# Patient Record
Sex: Male | Born: 1950 | Race: White | Hispanic: No | Smoking: Former smoker
Health system: Southern US, Community
[De-identification: ages and names within clinical notes are randomized; demographics above are authoritative.]

## PROBLEM LIST (undated history)

## (undated) DIAGNOSIS — E119 Type 2 diabetes mellitus without complications: Secondary | ICD-10-CM

## (undated) DIAGNOSIS — K219 Gastro-esophageal reflux disease without esophagitis: Secondary | ICD-10-CM

## (undated) DIAGNOSIS — F32A Depression, unspecified: Secondary | ICD-10-CM

## (undated) DIAGNOSIS — C801 Malignant (primary) neoplasm, unspecified: Secondary | ICD-10-CM

## (undated) DIAGNOSIS — I10 Essential (primary) hypertension: Secondary | ICD-10-CM

## (undated) DIAGNOSIS — F329 Major depressive disorder, single episode, unspecified: Secondary | ICD-10-CM

## (undated) DIAGNOSIS — F4310 Post-traumatic stress disorder, unspecified: Secondary | ICD-10-CM

## (undated) HISTORY — PX: PACEMAKER INSERTION: SHX728

## (undated) HISTORY — PX: PROSTATECTOMY: SHX69

## (undated) HISTORY — PX: MELANOMA EXCISION: SHX5266

## (undated) HISTORY — PX: BACK SURGERY: SHX140

## (undated) HISTORY — PX: TONSILLECTOMY AND ADENOIDECTOMY: SUR1326

---

## 2012-04-30 ENCOUNTER — Inpatient Hospital Stay: Payer: Self-pay | Admitting: Specialist

## 2012-04-30 LAB — COMPREHENSIVE METABOLIC PANEL
Alkaline Phosphatase: 141 U/L — ABNORMAL HIGH (ref 50–136)
Anion Gap: 6 — ABNORMAL LOW (ref 7–16)
BUN: 12 mg/dL (ref 7–18)
Calcium, Total: 9 mg/dL (ref 8.5–10.1)
Chloride: 104 mmol/L (ref 98–107)
Co2: 32 mmol/L (ref 21–32)
EGFR (African American): >60
Glucose: 131 mg/dL — ABNORMAL HIGH (ref 65–99)
Osmolality: 285 (ref 275–301)
Potassium: 3 mmol/L — ABNORMAL LOW (ref 3.5–5.1)
SGOT(AST): 23 U/L (ref 15–37)
SGPT (ALT): 14 U/L
Total Protein: 7.4 g/dL (ref 6.4–8.2)

## 2012-04-30 LAB — CBC
HCT: 47.1 % (ref 40.0–52.0)
MCH: 30.6 pg (ref 26.0–34.0)
Platelet: 113 10*3/uL — ABNORMAL LOW (ref 150–440)
RBC: 5.06 10*6/uL (ref 4.40–5.90)
RDW: 14.4 % (ref 11.5–14.5)
WBC: 10.1 10*3/uL (ref 3.8–10.6)

## 2012-04-30 LAB — LIPASE, BLOOD: Lipase: 77 U/L (ref 73–393)

## 2012-04-30 LAB — URINALYSIS, COMPLETE
Bilirubin,UR: NEGATIVE
Blood: NEGATIVE
Hyaline Cast: 6
Ketone: NEGATIVE
Leukocyte Esterase: NEGATIVE
Nitrite: NEGATIVE
Ph: 5 (ref 4.5–8.0)
Protein: NEGATIVE
RBC,UR: <1 /HPF (ref 0–5)
Specific Gravity: 1.013 (ref 1.003–1.030)
Squamous Epithelial: NONE SEEN
WBC UR: 4 /HPF (ref 0–5)

## 2012-04-30 LAB — PROTIME-INR
INR: 0.9
Prothrombin Time: 12.6 secs (ref 11.5–14.7)

## 2012-04-30 LAB — TSH: Thyroid Stimulating Horm: 2.01 u[IU]/mL

## 2012-04-30 LAB — MAGNESIUM: Magnesium: 1.8 mg/dL

## 2012-04-30 LAB — TROPONIN I: Troponin-I: <0.02 ng/mL

## 2012-04-30 LAB — CK TOTAL AND CKMB (NOT AT ARMC): CK, Total: 29 U/L — ABNORMAL LOW (ref 35–232)

## 2012-05-01 LAB — CBC WITH DIFFERENTIAL/PLATELET
Basophil #: 0 10*3/uL (ref 0.0–0.1)
Basophil %: 0.2 %
Eosinophil %: 0 %
Lymphocyte #: 0.5 10*3/uL — ABNORMAL LOW (ref 1.0–3.6)
Lymphocyte %: 7 %
MCV: 93 fL (ref 80–100)
Monocyte %: 1 %
Neutrophil %: 91.8 %
Platelet: 99 10*3/uL — ABNORMAL LOW (ref 150–440)
RBC: 4.62 10*6/uL (ref 4.40–5.90)
RDW: 14.5 % (ref 11.5–14.5)
WBC: 6.9 10*3/uL (ref 3.8–10.6)

## 2012-05-01 LAB — BASIC METABOLIC PANEL
Calcium, Total: 8.9 mg/dL (ref 8.5–10.1)
Chloride: 105 mmol/L (ref 98–107)
Co2: 29 mmol/L (ref 21–32)
Glucose: 212 mg/dL — ABNORMAL HIGH (ref 65–99)
Osmolality: 291 (ref 275–301)
Potassium: 3.5 mmol/L (ref 3.5–5.1)
Sodium: 142 mmol/L (ref 136–145)

## 2012-05-07 LAB — CULTURE, BLOOD (SINGLE)

## 2015-01-27 NOTE — H&P (Signed)
PATIENT NAME:  David Christensen, David Christensen MR#:  [id number] DATE OF BIRTH:  [birth year]  DATE OF ADMISSION:  04/30/2012  PRIMARY MD: [place]   ED REFERRING DOCTOR: Dr. Renard Hamper    CHIEF COMPLAINT: Back pain, shortness of breath.   HISTORY OF PRESENT ILLNESS: The patient is a 64 year old Caucasian male with history of having lumbar spine surgery in the 1990's after a disk rupture. He also has problem with chronic neck pain and neck disk herniation. He woke up with severe back pain. The patient reports that he did not lift anything heavy, has not had any trauma to his back, or has had any fall. That was one of the reasons he came to the ED but he was also having shortness of breath for the past few days. He is not on any oxygen at home and he was noted to have sats in the 80's and he came to the ED. He had an ABG which did show hypoxia. The patient reports that he has been having wheezing for the past few days. He has been having shortness of breath with exertion and has been having some productive cough with whitish sputum. He has not had any fevers or chills. He does not have any problem with nocturia, dyspnea, or orthopnea. He does complain of occasional swelling in his legs. He reports that he has been also having worsening shaking of his hands. He otherwise denies any chest pains or palpitations. No syncope. No abdominal pain, nausea, vomiting, or diarrhea. Denies any urinary frequency, urgency, or hesitancy.   PAST MEDICAL HISTORY: 1. Chronic neck pain.  2. Benign prostatic hypertrophy.  3. Chronic obstructive pulmonary disease.  4. Hypertension.  5. Diabetes, type II.  6. Bipolar/depression.   PAST SURGICAL HISTORY:  1. Status post lumbar diskectomy.  2. Bilateral thumb surgery.   ALLERGIES: None.   MEDICATIONS PER HIS LIST:  1. Sertraline 100 daily.  2. Latuda 40 mg daily.  3. Gabapentin 2400 mg. He does not have the frequency.  4. Ambien 10 mg at bedtime.  5. Omeprazole 20  daily.  6. Glimepiride 4 mg daily. 7. Losartan 100/25 daily.  8. Doxazosin 2 mg daily.  9. Aspirin 81 one tab p.o. daily.  10. Cyclobenzaprine 10 mg p.r.n.  11. Flonase 50 mcg into each nostril daily.  12. Albuterol as needed.  13. He is also on hydrocodone. Dose of this is not known.   SOCIAL HISTORY: Smokes 2 packs per day. He used to smoke and then quit for 16 years and now smoking back again. Denies any alcohol or drug use. Lives alone.   FAMILY HISTORY: Positive for hypertension. Otherwise, he is not aware of any heart disease or any other medical problems.   REVIEW OF SYSTEMS: CONSTITUTIONAL: Denies fever. Complains of some fatigue, weakness, back pain. No weight loss. No weight gain. EYES: No blurred or double vision. No redness. No inflammation. No glaucoma. ENT: No tinnitus. No ear pain. No hearing loss. No difficulty swallowing. RESPIRATORY: Complains of cough which is whitish sputum. Also complains of wheezing. No hemoptysis. Does have a history of COPD. CARDIOVASCULAR: Denies any chest pain, orthopnea, intermittent edema. No arrhythmias. No syncope. GI: No nausea, vomiting, diarrhea. No abdominal pain. No hematemesis. No melena. GU: Denies any dysuria, hematuria, renal colic, or frequency. ENDOCRINE: Denies any polyuria, nocturia, or thyroid problems. HEME/LYMPH: Denies any easy bruisability or bleeding. SKIN: No acne. No rash. No changes in mole, hair or skin. MUSCULOSKELETAL: Denies any pain in the  neck, back, or shoulder. No gout. NEUROLOGIC: No numbness. No CVA. No TIA. No seizures. PSYCHIATRIC: No anxiety. No insomnia. No ADD. No OCD.   PHYSICAL EXAMINATION:   VITAL SIGNS: Temperature 98.1, pulse 72, respirations 18, blood pressure 104/63, O2 97%.   GENERAL: The patient is a 64 year old Caucasian male in no acute distress.   HEENT: Pupils equal, round, reactive to light and accommodation. There is no conjunctival pallor. No scleral icterus. Extraocular movements intact. Nasal  exam shows no drainage or ulceration. Oropharynx is clear without any exudate.  NECK: No thyromegaly. No carotid bruits.   CARDIOVASCULAR: Regular rate and rhythm. No murmurs, rubs, clicks, or gallops. PMI is not displaced.   LUNGS: He has bilateral wheezing without any rales, rhonchi. There is no accessory muscle usage.   ABDOMEN: Soft, nontender, nondistended. Positive bowel sounds x4.   EXTREMITIES: No clubbing, cyanosis, or edema.   SKIN: No rash.   LYMPHATICS: No lymph nodes palpable.   VASCULAR: Good DP, PT pulses.   MUSCULOSKELETAL: He has some tenderness in the lower lumbar region.   NEUROLOGICAL: Awake, alert, oriented x3. Cranial nerves II through XII grossly intact without any focal deficits.   PSYCHIATRIC: Not anxious or depressed.   VASCULAR: Good DP, PT pulses.   LABORATORY, DIAGNOSTIC, AND RADIOLOGICAL DATA: Glucose 131, BUN 12, creatinine 1.30, sodium 142, potassium 3.0, chloride 104, CO2 32, calcium 9.0. LFTs showed alkaline phosphatase of 141. CPK 29. CK-MB less than 0.5. Troponin less than 0.02. WBC 10.1, hemoglobin 15.5, platelet count 113. INR 0.9. ABG pH 7.36, pCO2 55, pO2 43.   Chest x-ray is currently pending.   ASSESSMENT AND PLAN: The patient is a 64 year old white male with history of COPD, hypertension, and bipolar disorder who developed back pain earlier today, now improved, who comes to the ED with back pain and shortness of breath.  1. Shortness of breath likely due to acute on chronic COPD exacerbation. Will place him on nebulizers, IV Solu-Medrol, and p.o. antibiotics. Continue oxygen.  2. Back pain, history of lumbar surgery in the past, symptoms now improved. Will do a lumbar x-ray to make sure there is no significant pathology. 3. Hypertension. Will continue losartan/HCTZ.  4. Hypokalemia. Will replace his potassium.  5. Diabetes, type II. Will place on sliding scale insulin. Continue glimepiride.  6. Bipolar disorder. Will continue sertraline  and Latuda.  7. Restless leg syndrome. Will continue gabapentin as taking at home.  8. Gastroesophageal reflux disease. Will continue omeprazole.  9. Miscellaneous. Will place him on Lovenox for DVT prophylaxis.   TIME SPENT: 35 minutes.   ____________________________ Lafonda Mosses Posey Pronto, MD shp:drc D: 04/30/2012 18:31:12 ET T: 05/01/2012 06:03:38 ET JOB#: [id number]  cc: Davisha Linthicum H. Posey Pronto, MD, <Dictator> Alric Seton MD ELECTRONICALLY SIGNED 05/08/2012 12:12

## 2015-01-27 NOTE — Discharge Summary (Signed)
PATIENT NAME:  David Christensen, David Christensen MR#:  [id number] DATE OF BIRTH:  03/24/1951  DATE OF ADMISSION:  04/30/2012 DATE OF DISCHARGE:  05/01/2012  For a detailed note, please take a look at the history and physical done by Dr. Dustin Flock on admission.   DISCHARGE DIAGNOSES: 1. Chronic obstructive pulmonary disease exacerbation.  2. Chronic back pain.  3. Bipolar disorder.  4. Hypertension.  5. Diabetes.   DIET: The patient is being discharged on an American Diabetic Association low sodium, low fat diet.   ACTIVITY: As tolerated.   DISCHARGE FOLLOWUP: Followup with primary care physician at [facility] in the next 1 to 2 weeks.   DISCHARGE MEDICATIONS:  1. Zoloft 100 mg daily.  2. Latuda 40 mg daily.  3. Omeprazole 20 mg daily.  4. Hydrochlorothiazide/losartan 25/100 mg 1 tab daily.  5. Glimepiride 2 mg twice a day. 6. Flexeril 10 mg three times daily as needed.  7. Ambien 10 mg at bedtime. 8. Doxazosin 2 mg at bedtime.  9. Aspirin 81 mg daily.  10. Flonase one spray to each nostril daily.  11. Albuterol inhaler as needed.  12. Gabapentin 600 mg five tabs at bedtime.  13. Prednisone taper starting at 50 mg down to 10 mg over the next five days.   PERTINENT STUDIES: Chest x-ray done on admission showed no acute cardiopulmonary disease.  X-ray of the lumbar spine showed degenerative disk disease of L5-S1.   HOSPITAL COURSE: This 64 year old male with medical problems as mentioned above who presented to the hospital with shortness of breath and also back pain.  1. Shortness of breath likely secondary to chronic obstructive pulmonary disease exacerbation secondary to ongoing tobacco abuse. The patient's chest x-ray did not show any evidence of pneumonia. He was also empirically started on Levaquin, also started on IV steroids, and around-the-clock nebulizer treatments. The patient's clinical symptoms have significantly improved since admission. He is currently afebrile and  hemodynamically stable. He was ambulated on room air and did not qualify for any home oxygen. The patient currently is being discharged on a short prednisone taper and strongly advised to quit smoking.  2. Back pain. The patient underwent a lumbar spine x-ray which showed some degenerative disk disease in the L5-S1 area, although the back pain had improved with some p.r.n. Percocet. This can be further followed up by his primary care physician.  3. Hypertension. The patient remained hemodynamically stable and will continue his losartan/hydrochlorothiazide as mentioned.  4. Diabetes. The patient will continue his glimepiride as stated.  5. Bipolar disorder/depression. The patient is on Zoloft and he will continue that along with his Latuda.  6. Diabetic neuropathy/restless leg syndrome. The patient will continue his high dose Neurontin as stated.   CODE STATUS: The patient is a FULL CODE.  TIME SPENT: 35 minutes.  ____________________________ Belia Heman. Verdell Carmine, MD vjs:slb D: 05/01/2012 14:51:42 ET     T: 05/02/2012 11:11:57 ET        JOB#: [id number] cc: Belia Heman. Verdell Carmine, MD, <Dictator> PCP - [place] MD ELECTRONICALLY SIGNED 05/04/2012 16:19

## 2015-02-26 ENCOUNTER — Encounter: Payer: Self-pay | Admitting: Emergency Medicine

## 2015-02-26 ENCOUNTER — Inpatient Hospital Stay
Admission: EM | Admit: 2015-02-26 | Discharge: 2015-03-02 | DRG: 418 | Disposition: A | Discharge status: Home / Self Care | Payer: Medicare Other | Attending: Surgery | Admitting: Surgery

## 2015-02-26 ENCOUNTER — Emergency Department: Payer: Medicare Other

## 2015-02-26 DIAGNOSIS — Z823 Family history of stroke: Secondary | ICD-10-CM

## 2015-02-26 DIAGNOSIS — K219 Gastro-esophageal reflux disease without esophagitis: Secondary | ICD-10-CM | POA: Diagnosis present

## 2015-02-26 DIAGNOSIS — K567 Ileus, unspecified: Secondary | ICD-10-CM | POA: Diagnosis present

## 2015-02-26 DIAGNOSIS — Z87891 Personal history of nicotine dependence: Secondary | ICD-10-CM

## 2015-02-26 DIAGNOSIS — F329 Major depressive disorder, single episode, unspecified: Secondary | ICD-10-CM | POA: Diagnosis present

## 2015-02-26 DIAGNOSIS — K819 Cholecystitis, unspecified: Secondary | ICD-10-CM | POA: Insufficient documentation

## 2015-02-26 DIAGNOSIS — R109 Unspecified abdominal pain: Secondary | ICD-10-CM | POA: Diagnosis not present

## 2015-02-26 DIAGNOSIS — Z85828 Personal history of other malignant neoplasm of skin: Secondary | ICD-10-CM

## 2015-02-26 DIAGNOSIS — Z950 Presence of cardiac pacemaker: Secondary | ICD-10-CM

## 2015-02-26 DIAGNOSIS — I10 Essential (primary) hypertension: Secondary | ICD-10-CM | POA: Diagnosis present

## 2015-02-26 DIAGNOSIS — K810 Acute cholecystitis: Principal | ICD-10-CM | POA: Diagnosis present

## 2015-02-26 DIAGNOSIS — R1011 Right upper quadrant pain: Secondary | ICD-10-CM

## 2015-02-26 HISTORY — DX: Gastro-esophageal reflux disease without esophagitis: K21.9

## 2015-02-26 HISTORY — DX: Essential (primary) hypertension: I10

## 2015-02-26 HISTORY — DX: Malignant (primary) neoplasm, unspecified: C80.1

## 2015-02-26 HISTORY — DX: Depression, unspecified: F32.A

## 2015-02-26 HISTORY — DX: Major depressive disorder, single episode, unspecified: F32.9

## 2015-02-26 LAB — CBC
HCT: 41.1 % (ref 40.0–52.0)
HEMOGLOBIN: 14.2 g/dL (ref 13.0–18.0)
MCH: 32.4 pg (ref 26.0–34.0)
MCHC: 34.7 g/dL (ref 32.0–36.0)
MCV: 93.3 fL (ref 80.0–100.0)
Platelets: 100 10*3/uL — ABNORMAL LOW (ref 150–440)
RBC: 4.4 MIL/uL (ref 4.40–5.90)
RDW: 13.2 % (ref 11.5–14.5)
WBC: 7.2 10*3/uL (ref 3.8–10.6)

## 2015-02-26 LAB — COMPREHENSIVE METABOLIC PANEL
ALT: 15 U/L — ABNORMAL LOW (ref 17–63)
AST: 19 U/L (ref 15–41)
Albumin: 4.3 g/dL (ref 3.5–5.0)
Alkaline Phosphatase: 69 U/L (ref 38–126)
Anion gap: 10 (ref 5–15)
BUN: 24 mg/dL — ABNORMAL HIGH (ref 6–20)
CALCIUM: 9.5 mg/dL (ref 8.9–10.3)
CO2: 31 mmol/L (ref 22–32)
Chloride: 103 mmol/L (ref 101–111)
Creatinine, Ser: 0.92 mg/dL (ref 0.61–1.24)
GFR calc Af Amer: >60 mL/min (ref >60)
GFR calc non Af Amer: >60 mL/min (ref >60)
Glucose, Bld: 162 mg/dL — ABNORMAL HIGH (ref 65–99)
Potassium: 3.8 mmol/L (ref 3.5–5.1)
Sodium: 144 mmol/L (ref 135–145)
TOTAL PROTEIN: 6.9 g/dL (ref 6.5–8.1)
Total Bilirubin: 0.4 mg/dL (ref 0.3–1.2)

## 2015-02-26 LAB — LIPASE, BLOOD: LIPASE: 33 U/L (ref 22–51)

## 2015-02-26 MED ORDER — SODIUM CHLORIDE 0.9 % IV SOLN
INTRAVENOUS | Status: DC
  Administered 2015-02-26 – 2015-02-27 (×2): via INTRAVENOUS
  Administered 2015-02-27: 1000 mL via INTRAVENOUS
  Administered 2015-02-28: 02:00:00 via INTRAVENOUS
  Administered 2015-02-28: 1000 mL via INTRAVENOUS
  Administered 2015-03-01: via INTRAVENOUS
  Administered 2015-03-01: 1000 mL via INTRAVENOUS
  Administered 2015-03-02 (×2): via INTRAVENOUS

## 2015-02-26 MED ORDER — PANTOPRAZOLE SODIUM 40 MG IV SOLR
40.0000 mg | Freq: Every day | INTRAVENOUS | Status: DC
  Administered 2015-02-26 – 2015-03-01 (×4): 40 mg via INTRAVENOUS
  Filled 2015-02-26 (×4): qty 40

## 2015-02-26 MED ORDER — IOHEXOL 300 MG/ML  SOLN
100.0000 mL | Freq: Once | INTRAMUSCULAR | Status: AC | PRN
  Administered 2015-02-26: 100 mL via INTRAVENOUS

## 2015-02-26 MED ORDER — MORPHINE SULFATE 4 MG/ML IJ SOLN
4.0000 mg | Freq: Once | INTRAMUSCULAR | Status: AC
  Administered 2015-02-26: 4 mg via INTRAVENOUS

## 2015-02-26 MED ORDER — SODIUM CHLORIDE 0.9 % IV BOLUS (SEPSIS)
1000.0000 mL | Freq: Once | INTRAVENOUS | Status: AC
Start: 2015-02-26 — End: 2015-02-26
  Administered 2015-02-26: 1000 mL via INTRAVENOUS

## 2015-02-26 MED ORDER — HEPARIN SODIUM (PORCINE) 5000 UNIT/ML IJ SOLN
5000.0000 [IU] | Freq: Three times a day (TID) | INTRAMUSCULAR | Status: DC
  Administered 2015-02-26 – 2015-02-28 (×6): 5000 [IU] via SUBCUTANEOUS
  Filled 2015-02-26 (×6): qty 1

## 2015-02-26 MED ORDER — ONDANSETRON HCL 4 MG/2ML IJ SOLN
4.0000 mg | Freq: Once | INTRAMUSCULAR | Status: AC
  Administered 2015-02-26: 4 mg via INTRAVENOUS

## 2015-02-26 MED ORDER — HYDROMORPHONE HCL 1 MG/ML IJ SOLN
1.0000 mg | Freq: Once | INTRAMUSCULAR | Status: AC
  Administered 2015-02-26: 1 mg via INTRAVENOUS

## 2015-02-26 MED ORDER — CIPROFLOXACIN IN D5W 400 MG/200ML IV SOLN
INTRAVENOUS | Status: AC
  Filled 2015-02-26: qty 200

## 2015-02-26 MED ORDER — CIPROFLOXACIN IN D5W 400 MG/200ML IV SOLN
400.0000 mg | Freq: Two times a day (BID) | INTRAVENOUS | Status: DC
  Administered 2015-02-26 – 2015-03-01 (×7): 400 mg via INTRAVENOUS
  Filled 2015-02-26 (×8): qty 200

## 2015-02-26 MED ORDER — MORPHINE SULFATE 2 MG/ML IJ SOLN
2.0000 mg | Freq: Once | INTRAMUSCULAR | Status: AC
  Administered 2015-02-26: 2 mg via INTRAVENOUS

## 2015-02-26 MED ORDER — HYDROMORPHONE HCL 1 MG/ML IJ SOLN
INTRAMUSCULAR | Status: AC
  Administered 2015-02-26: 1 mg via INTRAVENOUS
  Filled 2015-02-26: qty 1

## 2015-02-26 MED ORDER — METRONIDAZOLE IN NACL 5-0.79 MG/ML-% IV SOLN
500.0000 mg | Freq: Three times a day (TID) | INTRAVENOUS | Status: DC
  Administered 2015-02-26 – 2015-03-01 (×10): 500 mg via INTRAVENOUS
  Filled 2015-02-26 (×13): qty 100

## 2015-02-26 MED ORDER — HYDROMORPHONE HCL 1 MG/ML IJ SOLN
1.0000 mg | INTRAMUSCULAR | Status: DC | PRN
  Administered 2015-02-26 – 2015-02-28 (×9): 1 mg via INTRAVENOUS
  Filled 2015-02-26 (×11): qty 1

## 2015-02-26 MED ORDER — IOHEXOL 240 MG/ML SOLN
50.0000 mL | INTRAMUSCULAR | Status: AC
  Administered 2015-02-26: 50 mL via ORAL

## 2015-02-26 MED ORDER — MORPHINE SULFATE 4 MG/ML IJ SOLN
INTRAMUSCULAR | Status: AC
  Administered 2015-02-26: 4 mg via INTRAVENOUS
  Filled 2015-02-26: qty 1

## 2015-02-26 MED ORDER — MORPHINE SULFATE 2 MG/ML IJ SOLN
INTRAMUSCULAR | Status: AC
  Administered 2015-02-26: 2 mg via INTRAVENOUS
  Filled 2015-02-26: qty 1

## 2015-02-26 MED ORDER — ONDANSETRON HCL 4 MG/2ML IJ SOLN
INTRAMUSCULAR | Status: AC
  Administered 2015-02-26: 4 mg via INTRAVENOUS
  Filled 2015-02-26: qty 2

## 2015-02-26 MED ORDER — ONDANSETRON HCL 4 MG/2ML IJ SOLN
4.0000 mg | Freq: Four times a day (QID) | INTRAMUSCULAR | Status: DC | PRN
Start: 2015-02-26 — End: 2015-03-02
  Administered 2015-02-28: 4 mg via INTRAVENOUS

## 2015-02-26 MED ORDER — TECHNETIUM TC 99M MEBROFENIN IV KIT
5.5000 | PACK | Freq: Once | INTRAVENOUS | Status: AC | PRN
  Administered 2015-02-26: 5.5 via INTRAVENOUS

## 2015-02-26 MED ORDER — MORPHINE SULFATE 4 MG/ML IJ SOLN
4.0000 mg | Freq: Once | INTRAMUSCULAR | Status: AC
Start: 2015-02-26 — End: 2015-02-26
  Administered 2015-02-26: 4 mg via INTRAVENOUS

## 2015-02-26 MED ORDER — TECHNETIUM TC 99M MEBROFENIN IV KIT
2.0000 | PACK | Freq: Once | INTRAVENOUS | Status: AC | PRN
  Administered 2015-02-26: 2.18 via INTRAVENOUS

## 2015-02-26 MED ORDER — MORPHINE SULFATE 4 MG/ML IJ SOLN
3.2000 mg | Freq: Once | INTRAMUSCULAR | Status: AC
  Administered 2015-02-26: 3.2 mg via INTRAVENOUS
  Filled 2015-02-26: qty 1

## 2015-02-26 MED ORDER — BACITRACIN ZINC 500 UNIT/GM EX OINT
TOPICAL_OINTMENT | CUTANEOUS | Status: AC
  Filled 2015-02-26: qty 0.9

## 2015-02-26 NOTE — ED Notes (Signed)
Asissted pt with urinal.

## 2015-02-26 NOTE — ED Notes (Signed)
Pt's O2 sat noted to drop into 80s. Pt placed on O2 2L via nasal cannula. MD made aware.

## 2015-02-26 NOTE — ED Notes (Signed)
Pt presents to ED via EMS from home with c/o severe abdominal pain. Pt reports having prostate surgery earlier today. Pt states, "She fixed me chili beans with onions... That's why I'm hurting." Pt reports generalized abdominal pain. Pt denies pain to groin area. Pt reports that he does not believe the pain is related to his surgery. Pt is awake and alert, crying during triage.

## 2015-02-26 NOTE — Progress Notes (Signed)
Reported to [facility]

## 2015-02-26 NOTE — H&P (Signed)
Surgery History and Physical  64 yo M with history of pacemaker placement who presents with 1 day of abdominal distention and pain.  Began acutely.  + nausea.  Pain located in bilateral lower periumbilical regions.  No fevers/chills.  Has never had this pain before. No chest pain, shortness of breath, headache, cough, diarrhea/constiopation, dysuria/hematuria.  Past Medical History  Diagnosis Date  . Hypertension   . Acid reflux   . Depression   . Cancer     skin cancer   Family History  Problem Relation Age of Onset  . Lung disease Mother   . Stroke Father    History   Social History  . Marital Status: Widowed    Spouse Name: N/A  . Number of Children: N/A  . Years of Education: N/A   Occupational History  . Not on file.   Social History Main Topics  . Smoking status: Former Research scientist (life sciences)  . Smokeless tobacco: Not on file  . Alcohol Use: 1.2 oz/week    2 Shots of liquor per week  . Drug Use: No  . Sexual Activity: Not on file   Other Topics Concern  . Not on file   Social History Narrative  . No narrative on file   Review of systems: Complete ROS obtained.  Pertinent positives and negatives as above.    Blood pressure 137/64, pulse 60, temperature 97.8 F (36.6 C), temperature source Oral, resp. rate 21, height 8' 6.5" (2.604 m), weight 80.786 kg (178 lb 1.6 oz), SpO2 93 %. GEN: NAD/A&ox3 HEAD: [place]/AT EYES: no scleral icterus, no conjunctivitis FACE: No obvious facial trauma CV: RRR, no MRG CHEST: clear bilaterally, moving air well ABD: Marked distention, mildtender lower abdomen EXT: moving all ext well, strength 5/5 NEURO: CN II-XII grossly intact, sensation intact all 4 ext  Labs:  Reviewed, WBC 7.2  CT: reviewed, thickened bladder, distended gallbladder U/S: distended gallbladder, no stones  A/P 64 yo M with abdominal pain and +distended gallbladder.  Not suggestive of cholecystitis, bladder thickening may be a clue.  Obtain HIDA scan, urinalysis.  Unsure  etiology of abdominal distention because not impressive on CT.

## 2015-02-26 NOTE — ED Provider Notes (Signed)
[facility] Emergency Department Provider Note  ____________________________________________  Time seen: 1:40 AM  I have reviewed the triage vital signs and the nursing notes.   HISTORY  Chief Complaint Abdominal Pain      HPI David Christensen is a 64 y.o. male presents with generalized abdominal pain 10 out of 10 that is described as sharp. Patient denies fever positive nausea no vomiting of note patient state pain started after eating chili beans and onions. Of note patient had a prostatectomy done yesterday.     Past Medical History  Diagnosis Date  . Hypertension   . Acid reflux   . Depression   . Cancer     skin cancer    There are no active problems to display for this patient.   Past Surgical History  Procedure Laterality Date  . Pacemaker insertion Left     No current outpatient prescriptions on file.  Allergies Metformin and related and Penicillins  No family history on file.  Social History History  Substance Use Topics  . Smoking status: Former Research scientist (life sciences)  . Smokeless tobacco: Not on file  . Alcohol Use: Yes    Review of Systems  Constitutional: Negative for fever. Eyes: Negative for visual changes. ENT: Negative for sore throat. Cardiovascular: Negative for chest pain. Respiratory: Negative for shortness of breath. Gastrointestinal: Positive for abdominal pain, vomiting and diarrhea. Genitourinary: Negative for dysuria. Musculoskeletal: Negative for back pain. Skin: Negative for rash. Neurological: Negative for headaches, focal weakness or numbness.    10-point ROS otherwise negative.  ____________________________________________   PHYSICAL EXAM:  VITAL SIGNS: ED Triage Vitals  Enc Vitals Group     BP 02/26/15 0132 106/36 mmHg     Pulse Rate 02/26/15 0132 61     Resp 02/26/15 0132 20     Temp 02/26/15 0132 97.9 F (36.6 C)     Temp Source 02/26/15 0132 Oral     SpO2 02/26/15 0132 100 %     Weight 02/26/15  0132 180 lb (81.647 kg)     Height 02/26/15 0132 5\' 6"  (1.676 m)     Head Cir --      Peak Flow --      Pain Score 02/26/15 0133 10     Pain Loc --      Pain Edu? --      Excl. in [place]? --      Constitutional: Alert and oriented. Well appearing and in no distress. Eyes: Conjunctivae are normal. PERRL. Normal extraocular movements. ENT   Head: Normocephalic and atraumatic.   Nose: No congestion/rhinnorhea.   Mouth/Throat: Mucous membranes are moist.   Neck: No stridor. Hematological/Lymphatic/Immunilogical: No cervical lymphadenopathy. Cardiovascular: Normal rate, regular rhythm. Normal and symmetric distal pulses are present in all extremities. No murmurs, rubs, or gallops. Respiratory: Normal respiratory effort without tachypnea nor retractions. Breath sounds are clear and equal bilaterally. No wheezes/rales/rhonchi. Gastrointestinal: Diffuse abdominal tenderness to palpation worse in the right upper quadrant. Positive guarding no rebound. Genitourinary: deferred Musculoskeletal: Nontender with normal range of motion in all extremities. No joint effusions.  No lower extremity tenderness nor edema. Neurologic:  Normal speech and language. No gross focal neurologic deficits are appreciated. Speech is normal.  Skin:  Skin is warm, dry and intact. No rash noted. Psychiatric: Mood and affect are normal. Speech and behavior are normal. Patient exhibits appropriate insight and judgment.  ____________________________________________    LABS (pertinent positives/negatives)  Labs Reviewed  CBC - Abnormal; Notable for the following:  Platelets 100 (*)    All other components within normal limits  COMPREHENSIVE METABOLIC PANEL - Abnormal; Notable for the following:    Glucose, Bld 162 (*)    BUN 24 (*)    ALT 15 (*)    All other components within normal limits  URINALYSIS COMPLETEWITH MICROSCOPIC ([facility])  - Abnormal; Notable for the following:    Color, Urine YELLOW (*)     APPearance HAZY (*)    Hgb urine dipstick 3+ (*)    pH 9.0 (*)    Protein, ur 100 (*)    Squamous Epithelial / LPF 0-5 (*)    All other components within normal limits  CBC - Abnormal; Notable for the following:    RBC 3.90 (*)    Hemoglobin 12.6 (*)    HCT 36.8 (*)    Platelets 75 (*)    All other components within normal limits  COMPREHENSIVE METABOLIC PANEL - Abnormal; Notable for the following:    Potassium 3.3 (*)    Glucose, Bld 148 (*)    Calcium 8.3 (*)    Total Protein 6.2 (*)    AST 261 (*)    ALT 260 (*)    Alkaline Phosphatase 188 (*)    All other components within normal limits  PLATELET COUNT - Abnormal; Notable for the following:    Platelets 84 (*)    All other components within normal limits  CBC - Abnormal; Notable for the following:    RBC 3.35 (*)    Hemoglobin 10.7 (*)    HCT 31.5 (*)    Platelets 90 (*)    All other components within normal limits  COMPREHENSIVE METABOLIC PANEL - Abnormal; Notable for the following:    Glucose, Bld 116 (*)    Calcium 7.9 (*)    Total Protein 5.7 (*)    Albumin 3.0 (*)    ALT 87 (*)    Total Bilirubin 0.2 (*)    All other components within normal limits  URINE CULTURE  LIPASE, BLOOD  SURGICAL PATHOLOGY     ____________________________________________       RADIOLOGY  ED abdomen pelvis revealed dilated gallbladder recommendation for ultrasound   ____________________________________________      INITIAL IMPRESSION / ASSESSMENT AND PLAN / ED COURSE  Pertinent labs & imaging results that were available during my care of the patient were reviewed by me and considered in my medical decision making (see chart for details).  History physical exam concern for intra-abdominal pathology as such CT scan of the abdomen performed which revealed dilated gallbladder. With recommendation for ultrasound.  ____________________________________________   FINAL CLINICAL IMPRESSION(S) / ED DIAGNOSES  Final  diagnoses:  RUQ abdominal pain      Gregor Hams, MD 03/12/15 [phone]

## 2015-02-27 DIAGNOSIS — Z823 Family history of stroke: Secondary | ICD-10-CM | POA: Diagnosis not present

## 2015-02-27 DIAGNOSIS — Z87891 Personal history of nicotine dependence: Secondary | ICD-10-CM | POA: Diagnosis not present

## 2015-02-27 DIAGNOSIS — K819 Cholecystitis, unspecified: Secondary | ICD-10-CM | POA: Diagnosis present

## 2015-02-27 DIAGNOSIS — I10 Essential (primary) hypertension: Secondary | ICD-10-CM | POA: Diagnosis present

## 2015-02-27 DIAGNOSIS — K567 Ileus, unspecified: Secondary | ICD-10-CM | POA: Diagnosis present

## 2015-02-27 DIAGNOSIS — F329 Major depressive disorder, single episode, unspecified: Secondary | ICD-10-CM | POA: Diagnosis present

## 2015-02-27 DIAGNOSIS — Z85828 Personal history of other malignant neoplasm of skin: Secondary | ICD-10-CM | POA: Diagnosis not present

## 2015-02-27 DIAGNOSIS — R109 Unspecified abdominal pain: Secondary | ICD-10-CM | POA: Diagnosis present

## 2015-02-27 DIAGNOSIS — K810 Acute cholecystitis: Secondary | ICD-10-CM | POA: Diagnosis present

## 2015-02-27 DIAGNOSIS — K219 Gastro-esophageal reflux disease without esophagitis: Secondary | ICD-10-CM | POA: Diagnosis present

## 2015-02-27 DIAGNOSIS — Z950 Presence of cardiac pacemaker: Secondary | ICD-10-CM | POA: Diagnosis not present

## 2015-02-27 LAB — URINALYSIS COMPLETE WITH MICROSCOPIC (ARMC ONLY)
Bacteria, UA: NONE SEEN
Bilirubin Urine: NEGATIVE
GLUCOSE, UA: NEGATIVE mg/dL
KETONES UR: NEGATIVE mg/dL
Leukocytes, UA: NEGATIVE
Nitrite: NEGATIVE
PH: 9 — AB (ref 5.0–8.0)
Protein, ur: 100 mg/dL — AB
Specific Gravity, Urine: 1.015 (ref 1.005–1.030)

## 2015-02-27 LAB — COMPREHENSIVE METABOLIC PANEL
ALT: 260 U/L — AB (ref 17–63)
ANION GAP: 7 (ref 5–15)
AST: 261 U/L — AB (ref 15–41)
Albumin: 3.6 g/dL (ref 3.5–5.0)
Alkaline Phosphatase: 188 U/L — ABNORMAL HIGH (ref 38–126)
BILIRUBIN TOTAL: 1 mg/dL (ref 0.3–1.2)
BUN: 12 mg/dL (ref 6–20)
CALCIUM: 8.3 mg/dL — AB (ref 8.9–10.3)
CO2: 28 mmol/L (ref 22–32)
Chloride: 104 mmol/L (ref 101–111)
Creatinine, Ser: 0.77 mg/dL (ref 0.61–1.24)
GFR calc Af Amer: >60 mL/min (ref >60)
GLUCOSE: 148 mg/dL — AB (ref 65–99)
Potassium: 3.3 mmol/L — ABNORMAL LOW (ref 3.5–5.1)
SODIUM: 139 mmol/L (ref 135–145)
TOTAL PROTEIN: 6.2 g/dL — AB (ref 6.5–8.1)

## 2015-02-27 LAB — CBC
HCT: 36.8 % — ABNORMAL LOW (ref 40.0–52.0)
HEMOGLOBIN: 12.6 g/dL — AB (ref 13.0–18.0)
MCH: 32.4 pg (ref 26.0–34.0)
MCHC: 34.4 g/dL (ref 32.0–36.0)
MCV: 94.3 fL (ref 80.0–100.0)
Platelets: 75 10*3/uL — ABNORMAL LOW (ref 150–440)
RBC: 3.9 MIL/uL — AB (ref 4.40–5.90)
RDW: 13.3 % (ref 11.5–14.5)
WBC: 5.3 10*3/uL (ref 3.8–10.6)

## 2015-02-27 MED ORDER — POTASSIUM CHLORIDE 10 MEQ/100ML IV SOLN
10.0000 meq | INTRAVENOUS | Status: AC
Start: 2015-02-27 — End: 2015-02-27
  Administered 2015-02-27 (×3): 10 meq via INTRAVENOUS
  Filled 2015-02-27 (×3): qty 100

## 2015-02-27 NOTE — Progress Notes (Signed)
Surgery Progress Note  S: Feels much better, multiple BM.  Hida with nonvisualization of gallbladder O: AF/VSS, good uop GEN: NAD/A&Ox3 ABD: soft, less distended, mild tender, particularly in RUQ  A/P 64 yo with likely acalculus cholecystitis, ileus - will discuss cholecystectomy if distention continues to improve, NPO for now

## 2015-02-28 ENCOUNTER — Inpatient Hospital Stay: Payer: Medicare Other | Admitting: Anesthesiology

## 2015-02-28 ENCOUNTER — Encounter
Admission: EM | Disposition: A | Discharge status: Home / Self Care | Payer: Self-pay | Source: Home / Self Care | Attending: Surgery

## 2015-02-28 HISTORY — PX: CHOLECYSTECTOMY: SHX55

## 2015-02-28 LAB — URINE CULTURE: CULTURE: NO GROWTH

## 2015-02-28 LAB — PLATELET COUNT: Platelets: 84 10*3/uL — ABNORMAL LOW (ref 150–440)

## 2015-02-28 SURGERY — LAPAROSCOPIC CHOLECYSTECTOMY
Anesthesia: General

## 2015-02-28 MED ORDER — FENTANYL CITRATE (PF) 100 MCG/2ML IJ SOLN
25.0000 ug | INTRAMUSCULAR | Status: DC | PRN
  Administered 2015-02-28 (×4): 25 ug via INTRAVENOUS

## 2015-02-28 MED ORDER — DEXAMETHASONE SODIUM PHOSPHATE 4 MG/ML IJ SOLN
INTRAMUSCULAR | Status: DC | PRN
  Administered 2015-02-28: 4 mg via INTRAVENOUS

## 2015-02-28 MED ORDER — NEOSTIGMINE METHYLSULFATE 10 MG/10ML IV SOLN
INTRAVENOUS | Status: DC | PRN
  Administered 2015-02-28: 4 mg via INTRAVENOUS

## 2015-02-28 MED ORDER — ACETAMINOPHEN 10 MG/ML IV SOLN
INTRAVENOUS | Status: AC
  Filled 2015-02-28: qty 100

## 2015-02-28 MED ORDER — KETOROLAC TROMETHAMINE 30 MG/ML IJ SOLN
INTRAMUSCULAR | Status: DC | PRN
  Administered 2015-02-28: 30 mg via INTRAVENOUS

## 2015-02-28 MED ORDER — ONDANSETRON HCL 4 MG/2ML IJ SOLN: 4.0000 mg | Freq: Once | INTRAMUSCULAR | Status: DC | PRN

## 2015-02-28 MED ORDER — MORPHINE SULFATE 2 MG/ML IJ SOLN
2.0000 mg | INTRAMUSCULAR | Status: DC | PRN
  Administered 2015-02-28: 4 mg via INTRAVENOUS
  Administered 2015-02-28: 2 mg via INTRAVENOUS
  Administered 2015-02-28: 4 mg via INTRAVENOUS
  Administered 2015-02-28: 2 mg via INTRAVENOUS
  Administered 2015-03-01 (×2): 4 mg via INTRAVENOUS
  Administered 2015-03-01: 2 mg via INTRAVENOUS
  Filled 2015-02-28: qty 2
  Filled 2015-02-28: qty 1
  Filled 2015-02-28: qty 2
  Filled 2015-02-28 (×2): qty 1
  Filled 2015-02-28 (×3): qty 2

## 2015-02-28 MED ORDER — PROPOFOL 10 MG/ML IV BOLUS
INTRAVENOUS | Status: DC | PRN
  Administered 2015-02-28: 200 mg via INTRAVENOUS

## 2015-02-28 MED ORDER — GLYCOPYRROLATE 0.2 MG/ML IJ SOLN
INTRAMUSCULAR | Status: DC | PRN
  Administered 2015-02-28: 0.6 mg via INTRAVENOUS

## 2015-02-28 MED ORDER — ROCURONIUM BROMIDE 100 MG/10ML IV SOLN
INTRAVENOUS | Status: DC | PRN
  Administered 2015-02-28 (×2): 10 mg via INTRAVENOUS
  Administered 2015-02-28: 40 mg via INTRAVENOUS
  Administered 2015-02-28: 10 mg via INTRAVENOUS

## 2015-02-28 MED ORDER — LACTATED RINGERS IV SOLN
INTRAVENOUS | Status: DC | PRN
  Administered 2015-02-28 (×2): via INTRAVENOUS

## 2015-02-28 MED ORDER — LIDOCAINE HCL (CARDIAC) 20 MG/ML IV SOLN
INTRAVENOUS | Status: DC | PRN
  Administered 2015-02-28: 60 mg via INTRAVENOUS

## 2015-02-28 MED ORDER — MIDAZOLAM HCL 2 MG/2ML IJ SOLN
INTRAMUSCULAR | Status: DC | PRN
  Administered 2015-02-28: 2 mg via INTRAVENOUS

## 2015-02-28 MED ORDER — ACETAMINOPHEN 10 MG/ML IV SOLN
INTRAVENOUS | Status: DC | PRN
  Administered 2015-02-28: 1000 mg via INTRAVENOUS

## 2015-02-28 MED ORDER — FENTANYL CITRATE (PF) 100 MCG/2ML IJ SOLN
INTRAMUSCULAR | Status: DC | PRN
  Administered 2015-02-28: 100 ug via INTRAVENOUS
  Administered 2015-02-28 (×2): 50 ug via INTRAVENOUS
  Administered 2015-02-28: 100 ug via INTRAVENOUS
  Administered 2015-02-28: 50 ug via INTRAVENOUS

## 2015-02-28 MED ORDER — MORPHINE BOLUS VIA INFUSION
1.0000 mg | INTRAVENOUS | Status: DC | PRN
  Filled 2015-02-28: qty 4

## 2015-02-28 MED ORDER — LIDOCAINE HCL 1 % IJ SOLN
INTRAMUSCULAR | Status: DC | PRN
  Administered 2015-02-28: 8 mL via INTRAMUSCULAR

## 2015-02-28 MED ORDER — FENTANYL CITRATE (PF) 100 MCG/2ML IJ SOLN
INTRAMUSCULAR | Status: AC
  Filled 2015-02-28: qty 2

## 2015-02-28 SURGICAL SUPPLY — 59 items
APPLIER CLIP ROT 10 11.4 M/L (STAPLE) ×3
BAG COUNTER SPONGE EZ (MISCELLANEOUS) IMPLANT
BENZOIN TINCTURE PRP APPL 2/3 (GAUZE/BANDAGES/DRESSINGS) ×3 IMPLANT
BLADE CLIPPER SURG (BLADE) ×3 IMPLANT
BLADE SURG SZ11 CARB STEEL (BLADE) ×3 IMPLANT
BULB RESERV EVAC DRAIN JP 100C (MISCELLANEOUS) ×3 IMPLANT
CANISTER SUCT 1200ML W/VALVE (MISCELLANEOUS) ×3 IMPLANT
CATH REDDICK CHOLANGI 4FR 50CM (CATHETERS) IMPLANT
CHLORAPREP W/TINT 26ML (MISCELLANEOUS) ×3 IMPLANT
CLIP APPLIE ROT 10 11.4 M/L (STAPLE) ×1 IMPLANT
CLOSURE WOUND 1/2 X4 (GAUZE/BANDAGES/DRESSINGS) ×1
CONRAY 60ML FOR OR (MISCELLANEOUS) IMPLANT
COUNTER SPONGE BAG EZ (MISCELLANEOUS)
CUTTER LINEAR ENDO 35 ART FLEX (STAPLE) ×3 IMPLANT
DISSECTOR KITTNER STICK (MISCELLANEOUS) IMPLANT
DISSECTORS/KITTNER STICK (MISCELLANEOUS)
DRAIN CHANNEL JP 19F (MISCELLANEOUS) ×3 IMPLANT
DRAPE SHEET LG 3/4 BI-LAMINATE (DRAPES) IMPLANT
DRESSING TELFA 4X3 1S ST N-ADH (GAUZE/BANDAGES/DRESSINGS) ×3 IMPLANT
DRSG TEGADERM 2-3/8X2-3/4 SM (GAUZE/BANDAGES/DRESSINGS) ×12 IMPLANT
DRSG TEGADERM 4X4.75 (GAUZE/BANDAGES/DRESSINGS) ×3 IMPLANT
DRSG TELFA 3X8 NADH (GAUZE/BANDAGES/DRESSINGS) ×3 IMPLANT
ENDOLOOP SUT PDS II  0 18 (SUTURE) ×2
ENDOLOOP SUT PDS II 0 18 (SUTURE) ×1 IMPLANT
ENDOPOUCH RETRIEVER 10 (MISCELLANEOUS) ×3 IMPLANT
GLOVE BIO SURGEON STRL SZ7.5 (GLOVE) ×9 IMPLANT
GOWN STRL REUS W/ TWL LRG LVL3 (GOWN DISPOSABLE) ×2 IMPLANT
GOWN STRL REUS W/TWL LRG LVL3 (GOWN DISPOSABLE) ×4
HEMOSTAT SURGICEL 2X14 (HEMOSTASIS) ×3 IMPLANT
IRRIGATION STRYKERFLOW (MISCELLANEOUS) ×1 IMPLANT
IRRIGATOR STRYKERFLOW (MISCELLANEOUS) ×3
IV CATH ANGIO 12GX3 LT BLUE (NEEDLE) IMPLANT
IV NS 1000ML (IV SOLUTION) ×2
IV NS 1000ML BAXH (IV SOLUTION) ×1 IMPLANT
LABEL OR SOLS (LABEL) ×3 IMPLANT
LIQUID BAND (GAUZE/BANDAGES/DRESSINGS) IMPLANT
NDL SAFETY 25GX1.5 (NEEDLE) ×3 IMPLANT
NS IRRIG 500ML POUR BTL (IV SOLUTION) ×3 IMPLANT
PACK LAP CHOLECYSTECTOMY (MISCELLANEOUS) ×3 IMPLANT
PAD GROUND ADULT SPLIT (MISCELLANEOUS) ×3 IMPLANT
RELOAD /EVU35 (ENDOMECHANICALS) ×3 IMPLANT
SCISSORS METZENBAUM CVD 33 (INSTRUMENTS) ×3 IMPLANT
SEAL FOR SCOPE WARMER C3101 (MISCELLANEOUS) IMPLANT
SLEEVE ENDOPATH XCEL 5M (ENDOMECHANICALS) ×3 IMPLANT
SPONGE DRAIN TRACH 4X4 STRL 2S (GAUZE/BANDAGES/DRESSINGS) ×3 IMPLANT
STRAP SAFETY BODY (MISCELLANEOUS) ×3 IMPLANT
STRIP CLOSURE SKIN 1/2X4 (GAUZE/BANDAGES/DRESSINGS) ×2 IMPLANT
SUT ETHILON 3-0 KS 30 BLK (SUTURE) ×3 IMPLANT
SUT MNCRL 4-0 (SUTURE) ×2
SUT MNCRL 4-0 27XMFL (SUTURE) ×1
SUT VICRYL 0 AB UR-6 (SUTURE) ×3 IMPLANT
SUTURE MNCRL 4-0 27XMF (SUTURE) ×1 IMPLANT
SWABSTK COMLB BENZOIN TINCTURE (MISCELLANEOUS) ×3 IMPLANT
TAPE TRANSPORE STRL 2 31045 (GAUZE/BANDAGES/DRESSINGS) ×3 IMPLANT
TROCAR XCEL BLUNT TIP 100MML (ENDOMECHANICALS) ×3 IMPLANT
TROCAR XCEL NON-BLD 11X100MML (ENDOMECHANICALS) IMPLANT
TROCAR XCEL NON-BLD 5MMX100MML (ENDOMECHANICALS) ×3 IMPLANT
TUBING INSUFFLATOR HI FLOW (MISCELLANEOUS) ×3 IMPLANT
WATER STERILE IRR 1000ML POUR (IV SOLUTION) IMPLANT

## 2015-02-28 NOTE — Op Note (Signed)
Preop dx: Acute calculus cholecystitis Postop dx: Acute calculus cholecystitis Procedure performed: Laparoscopic cholecystectomy Anesthesia: General EBL: 75 ml Complications: None Specimen: gallbladder  Indication for surgery: Mr. Schweer is a pleasant 64 yo F who presents with recurrent RUQ pain and + HIDA scan.  He was thought to benefit from cholecystectomy.  Details of surgery: Informed consent was obtained.  Mr. Kubicek was brought to the OR suite and laid supine on the OR table.  He was induced, ETT was placed, general anesthesia was administered.  Her abdomen was prepped and draped.  A timeout was performed correctly identifying patient name, operative site and procedure to be performed.  A supraumbilical incision was made and deepened to the fascia.  The fascia was incised, peritoneum was entered.  Two stay sutures were placed through the fasciotomy.  Hassan trocar was placed and abdomen was insufflated.  An 27mm epigastric and 2 5 mm subcostal trocars were placed.  Gallbladder was quite inflamed with a large amount of adhesions.  Cystic duct and cystic artery were dissected out and critical view was obtained.  Cystic artery was clipped and ligated, cystic duct was mildly enlarged and transected with an endostapler.  Gallbladder was then taken off fossa and removed through umbilicus. There was pulsitile bleeding likely from a small mesenteric vessel cephalad to the duodenum which was controlled with a clip.  Surgicel was placed to provide further hemostasis of this area.  Fossa attempted to be made hemostatic and irrigated but still was slightly oozing.  A 46F JP drain was placed in the gallbladder fossa through the lateral most port site and sutured in place with a 3-0 nylon suture.  Trocars were then removed and abdomen desufflated.  Supraumbilical fascia was then closed with previously placed stay sutures.  Skin was then closed with interrupted deep dermal 4-0 monocryl.  Suture strips, telfa and  tegaderm were used to dress incision.  Patient was then awoken, extubated and brought to PACU.  There were no immediate complications. Needle, sponge and instrument count was correct at the end of the procedure.

## 2015-02-28 NOTE — Brief Op Note (Signed)
02/26/2015 - 02/28/2015  10:12 AM   PATIENT:  David Christensen  64 y.o. male  PRE-OPERATIVE DIAGNOSIS:  CHOLECYSTITIS  POST-OPERATIVE DIAGNOSIS:  same  PROCEDURE:  Procedure(s): LAPAROSCOPIC CHOLECYSTECTOMY (N/A)  SURGEON:  Surgeon(s) and Role:    * Marlyce Huge, MD - Primary  PHYSICIAN ASSISTANT:   ASSISTANTS: none   ANESTHESIA:   general  EBL:  Total I/O In: 1400 [I.V.:1400] Out: 75 [Blood:75]  BLOOD ADMINISTERED:none  DRAINS: 10 F JP drain in the gallbladder fossa  LOCAL MEDICATIONS USED:  LIDOCAINE   SPECIMEN:  Excision  DISPOSITION OF SPECIMEN:  PATHOLOGY  COUNTS:  YES  DICTATION: .Note written in EPIC  PLAN OF CARE: Admit to inpatient   PATIENT DISPOSITION:  PACU - hemodynamically stable.   Delay start of Pharmacological VTE agent (>24hrs) due to surgical blood loss or risk of bleeding: no

## 2015-02-28 NOTE — Anesthesia Preprocedure Evaluation (Addendum)
Anesthesia Evaluation  Patient identified by MRN, date of birth, ID band Patient awake    Reviewed: Allergy & Precautions, NPO status , Patient's Chart, lab work & pertinent test results  History of Anesthesia Complications (+) PONV  Airway Mallampati: II  TM Distance: >3 FB Neck ROM: Full    Dental  (+) Edentulous Upper, Edentulous Lower   Pulmonary former smoker (quit x 2 years),          Cardiovascular hypertension, Pt. on medications + pacemaker (symptomatic bradycardia)     Neuro/Psych Depression    GI/Hepatic GERD-  Medicated and Controlled,  Endo/Other    Renal/GU      Musculoskeletal   Abdominal   Peds  Hematology   Anesthesia Other Findings   Reproductive/Obstetrics                            Anesthesia Physical Anesthesia Plan  ASA: III  Anesthesia Plan: General   Post-op Pain Management:    Induction: Intravenous  Airway Management Planned: Oral ETT  Additional Equipment:   Intra-op Plan:   Post-operative Plan:   Informed Consent: I have reviewed the patients History and Physical, chart, labs and discussed the procedure including the risks, benefits and alternatives for the proposed anesthesia with the patient or authorized representative who has indicated his/her understanding and acceptance.     Plan Discussed with:   Anesthesia Plan Comments:         Anesthesia Quick Evaluation

## 2015-02-28 NOTE — Progress Notes (Signed)
Surgery Progress Note  S: No acute issues.  Still with pain O: AF/VSS, good uop GEN: NAD/A&Ox3 ABD: soft, tender RUQ, nondistended  A/P 64 yo admit with acalculus cholecystitis - to OR today for lap cholecystectomy

## 2015-02-28 NOTE — Anesthesia Postprocedure Evaluation (Signed)
  Anesthesia Post-op Note  Patient: David Christensen  Procedure(s) Performed: Procedure(s): LAPAROSCOPIC CHOLECYSTECTOMY (N/A)  Anesthesia type:General  Patient location: PACU  Post pain: Pain level controlled  Post assessment: Post-op Vital signs reviewed, Patient's Cardiovascular Status Stable, Respiratory Function Stable, Patent Airway and No signs of Nausea or vomiting  Post vital signs: Reviewed and stable  Last Vitals:  Filed Vitals:   02/28/15 1048  BP: 104/62  Pulse: 58  Temp:   Resp: 23    Level of consciousness: awake, alert  and patient cooperative  Complications: No apparent anesthesia complications

## 2015-02-28 NOTE — Anesthesia Procedure Notes (Signed)
Procedure Name: Intubation Performed by: Sinda Du Pre-anesthesia Checklist: Patient identified, Patient being monitored, Timeout performed, Emergency Drugs available and Suction available Patient Re-evaluated:Patient Re-evaluated prior to inductionOxygen Delivery Method: Circle system utilized Preoxygenation: Pre-oxygenation with 100% oxygen Intubation Type: IV induction Ventilation: Mask ventilation without difficulty Laryngoscope Size: Mac and 3 Grade View: Grade I Tube type: Oral Tube size: 7.5 mm Number of attempts: 1 Airway Equipment and Method: Stylet Placement Confirmation: ETT inserted through vocal cords under direct vision,  positive ETCO2 and breath sounds checked- equal and bilateral Secured at: 21 cm Tube secured with: Tape Dental Injury: Teeth and Oropharynx as per pre-operative assessment

## 2015-02-28 NOTE — Transfer of Care (Signed)
Immediate Anesthesia Transfer of Care Note  Patient: David Christensen  Procedure(s) Performed: Procedure(s): LAPAROSCOPIC CHOLECYSTECTOMY (N/A)  Patient Location: PACU  Anesthesia Type:General  Level of Consciousness: awake  Airway & Oxygen Therapy: Patient connected to face mask oxygen  Post-op Assessment: Report given to RN  Post vital signs: stable  Last Vitals:  Filed Vitals:   02/28/15 0753  BP: 157/87  Pulse:   Temp: 36.8 C  Resp: 19    Complications: No apparent anesthesia complications

## 2015-03-01 LAB — COMPREHENSIVE METABOLIC PANEL
ALBUMIN: 3 g/dL — AB (ref 3.5–5.0)
ALT: 87 U/L — ABNORMAL HIGH (ref 17–63)
AST: 24 U/L (ref 15–41)
Alkaline Phosphatase: 110 U/L (ref 38–126)
Anion gap: 5 (ref 5–15)
BUN: 14 mg/dL (ref 6–20)
CALCIUM: 7.9 mg/dL — AB (ref 8.9–10.3)
CO2: 27 mmol/L (ref 22–32)
CREATININE: 0.84 mg/dL (ref 0.61–1.24)
Chloride: 109 mmol/L (ref 101–111)
GFR calc Af Amer: >60 mL/min (ref >60)
GFR calc non Af Amer: >60 mL/min (ref >60)
GLUCOSE: 116 mg/dL — AB (ref 65–99)
POTASSIUM: 3.5 mmol/L (ref 3.5–5.1)
Sodium: 141 mmol/L (ref 135–145)
Total Bilirubin: 0.2 mg/dL — ABNORMAL LOW (ref 0.3–1.2)
Total Protein: 5.7 g/dL — ABNORMAL LOW (ref 6.5–8.1)

## 2015-03-01 LAB — CBC
HCT: 31.5 % — ABNORMAL LOW (ref 40.0–52.0)
HEMOGLOBIN: 10.7 g/dL — AB (ref 13.0–18.0)
MCH: 32 pg (ref 26.0–34.0)
MCHC: 34.1 g/dL (ref 32.0–36.0)
MCV: 93.9 fL (ref 80.0–100.0)
Platelets: 90 10*3/uL — ABNORMAL LOW (ref 150–440)
RBC: 3.35 MIL/uL — AB (ref 4.40–5.90)
RDW: 13.2 % (ref 11.5–14.5)
WBC: 4.9 10*3/uL (ref 3.8–10.6)

## 2015-03-01 MED ORDER — OXYCODONE-ACETAMINOPHEN 5-325 MG PO TABS: 1.0000 | ORAL_TABLET | ORAL | Status: DC | PRN

## 2015-03-01 MED ORDER — SODIUM CHLORIDE 0.9 % IV BOLUS (SEPSIS)
1000.0000 mL | Freq: Once | INTRAVENOUS | Status: AC
  Administered 2015-03-01: 1000 mL via INTRAVENOUS

## 2015-03-01 MED ORDER — OXYCODONE-ACETAMINOPHEN 5-325 MG PO TABS
1.0000 | ORAL_TABLET | ORAL | Status: DC | PRN
  Administered 2015-03-02: 2 via ORAL
  Filled 2015-03-01: qty 2

## 2015-03-01 MED ORDER — SERTRALINE HCL 50 MG PO TABS
50.0000 mg | ORAL_TABLET | Freq: Every day | ORAL | Status: DC
  Administered 2015-03-01 – 2015-03-02 (×2): 50 mg via ORAL
  Filled 2015-03-01 (×2): qty 1

## 2015-03-01 MED ORDER — PREGABALIN 50 MG PO CAPS
100.0000 mg | ORAL_CAPSULE | Freq: Three times a day (TID) | ORAL | Status: DC
  Administered 2015-03-01 – 2015-03-02 (×4): 100 mg via ORAL
  Filled 2015-03-01 (×4): qty 2

## 2015-03-01 MED ORDER — OXYCODONE-ACETAMINOPHEN 5-325 MG PO TABS
1.0000 | ORAL_TABLET | ORAL | Status: DC | PRN
Start: 2015-03-01 — End: 2015-03-01

## 2015-03-01 MED ORDER — TAMSULOSIN HCL 0.4 MG PO CAPS
0.4000 mg | ORAL_CAPSULE | Freq: Every day | ORAL | Status: DC
  Administered 2015-03-01: 0.4 mg via ORAL
  Filled 2015-03-01: qty 1

## 2015-03-01 NOTE — Progress Notes (Signed)
Surgery Progress Note  S: Feels much better, low JP output, low UOP GEN: NAD/A&Ox3 ABD: soft, min tender, incisions c/d/i  A/P 64 yo s/p lap cholecysetctomy for significant cholecystitis - labs - liquids for now - home meds - possibly advance diet later

## 2015-03-01 NOTE — Discharge Instructions (Signed)
Do not drive on pain medications Do not lift greater than 15 lbs for a period of 6 weeks Call or return to ER if you develop fever greater than 101.5, nausea/vomiting, increased pain, redness/drainage from incisions Take bandages off in 48 hours.  Okay to shower with bandages on or after they come off, no tub baths

## 2015-03-02 ENCOUNTER — Encounter: Payer: Self-pay | Admitting: Surgery

## 2015-03-02 NOTE — Discharge Summary (Signed)
Physician Discharge Summary  Patient ID: Srikar Chiang MRN: [id number] DOB/AGE: 66/02/1951 66 y.o.  Admit date: 02/26/2015 Discharge date: 03/02/2015  Admission Diagnoses:  Discharge Diagnoses:  Active Problems:   Abdominal pain   Cholecystitis   Discharged Condition: Stable and improved  Hospital Course: Admitted with acute cholecystitis,  Laparoscopic cholecystectomy done and JP left in place.  Tolerated diet and oral pain meds,  No bile seen in JP but due to difficult nature of operation JP left upon discharge.   Treatments: cholecystectomy  Discharge Exam: Blood pressure 135/86, pulse 65, temperature 98.3 F (36.8 C), temperature source Oral, resp. rate 16, height 8' 6.5" (2.604 m), weight 80.786 kg (178 lb 1.6 oz), SpO2 98 %.   Disposition: 01-Home or Self Care   Signed: Sherri Rad 03/02/2015, 6:38 PM

## 2015-03-02 NOTE — Progress Notes (Signed)
POD 2  Feels ok.  Wants to go home AFVSS abd soft, Jp non-bilious Stable and improved for discharge Goes home with JP in place. F/u in our office this Friday in [place] office.

## 2015-03-05 LAB — SURGICAL PATHOLOGY

## 2016-06-02 ENCOUNTER — Encounter: Payer: Self-pay | Admitting: Emergency Medicine

## 2016-06-02 ENCOUNTER — Emergency Department
Admission: EM | Admit: 2016-06-02 | Discharge: 2016-06-03 | Disposition: A | Discharge status: Home / Self Care | Payer: Medicare Other | Attending: Emergency Medicine | Admitting: Emergency Medicine

## 2016-06-02 DIAGNOSIS — Z79899 Other long term (current) drug therapy: Secondary | ICD-10-CM | POA: Insufficient documentation

## 2016-06-02 DIAGNOSIS — I10 Essential (primary) hypertension: Secondary | ICD-10-CM | POA: Insufficient documentation

## 2016-06-02 DIAGNOSIS — Z87891 Personal history of nicotine dependence: Secondary | ICD-10-CM | POA: Insufficient documentation

## 2016-06-02 DIAGNOSIS — T7840XA Allergy, unspecified, initial encounter: Secondary | ICD-10-CM

## 2016-06-02 DIAGNOSIS — Z7982 Long term (current) use of aspirin: Secondary | ICD-10-CM | POA: Insufficient documentation

## 2016-06-02 DIAGNOSIS — L500 Allergic urticaria: Secondary | ICD-10-CM | POA: Diagnosis not present

## 2016-06-02 DIAGNOSIS — Z85828 Personal history of other malignant neoplasm of skin: Secondary | ICD-10-CM | POA: Diagnosis not present

## 2016-06-02 DIAGNOSIS — L509 Urticaria, unspecified: Secondary | ICD-10-CM

## 2016-06-02 MED ORDER — FAMOTIDINE IN NACL 20-0.9 MG/50ML-% IV SOLN
20.0000 mg | Freq: Once | INTRAVENOUS | Status: AC
  Administered 2016-06-02: 20 mg via INTRAVENOUS

## 2016-06-02 MED ORDER — METHYLPREDNISOLONE SODIUM SUCC 125 MG IJ SOLR
INTRAMUSCULAR | Status: AC
  Administered 2016-06-02: 125 mg via INTRAVENOUS
  Filled 2016-06-02: qty 2

## 2016-06-02 MED ORDER — FAMOTIDINE IN NACL 20-0.9 MG/50ML-% IV SOLN
INTRAVENOUS | Status: AC
  Administered 2016-06-02: 20 mg via INTRAVENOUS
  Filled 2016-06-02: qty 50

## 2016-06-02 MED ORDER — METHYLPREDNISOLONE SODIUM SUCC 125 MG IJ SOLR
125.0000 mg | Freq: Once | INTRAMUSCULAR | Status: AC
  Administered 2016-06-02: 125 mg via INTRAVENOUS

## 2016-06-02 NOTE — ED Triage Notes (Signed)
Pt arrived to ED by EMS from home post allergic reaction, unknown source. EMS reports pt noticed hives, swelling and trouble swallowing after returning home from wielding. Pt took zyrtec x2, benadryl x4. Upon arrival to ED, pt is A&O x4, no acute distress noted. O2 98%, RA

## 2016-06-03 MED ORDER — FAMOTIDINE IN NACL 20-0.9 MG/50ML-% IV SOLN: 20.0000 mg | Freq: Once | INTRAVENOUS | Status: DC

## 2016-06-03 MED ORDER — PREDNISONE 20 MG PO TABS: 60.0000 mg | ORAL_TABLET | Freq: Every day | ORAL | 0 refills | Status: AC

## 2016-06-03 MED ORDER — DIPHENHYDRAMINE HCL 50 MG/ML IJ SOLN
25.0000 mg | Freq: Once | INTRAMUSCULAR | Status: AC
  Administered 2016-06-03: 25 mg via INTRAVENOUS
  Filled 2016-06-03: qty 1

## 2016-06-03 MED ORDER — EPINEPHRINE 0.3 MG/0.3ML IJ SOAJ: 0.3000 mg | Freq: Once | INTRAMUSCULAR | 0 refills | Status: AC

## 2016-06-03 MED ORDER — METHYLPREDNISOLONE SODIUM SUCC 125 MG IJ SOLR: 125.0000 mg | Freq: Once | INTRAMUSCULAR | Status: DC

## 2016-06-03 NOTE — ED Provider Notes (Signed)
[facility] Emergency Department Provider Note _____   First MD Initiated Contact with Patient 06/02/16 2339     (approximate)  I have reviewed the triage vital signs and the nursing notes.   HISTORY  Chief Complaint Allergic Reaction    HPI David Christensen is a 65 y.o. male presents with generalized pruritic rash onset approximately 7 PM this evening. Patient admits to hives lower extremities lip swelling with mild difficulty swallowing. Patient states that he took 2 Zyrtec and 4 Benadryl at 7 PM without any improvement of the rash. Patient denies any difficulty breathing. Patient denies any known cause of his allergic reaction but does admit to having previous episodes of the same.   Past Medical History:  Diagnosis Date  . Acid reflux   . Cancer ([place])    skin cancer  . Depression   . Hypertension     Patient Active Problem List   Diagnosis Date Noted  . Cholecystitis 02/27/2015  . Abdominal pain 02/26/2015    Past Surgical History:  Procedure Laterality Date  . BACK SURGERY    . CHOLECYSTECTOMY N/A 02/28/2015   Procedure: LAPAROSCOPIC CHOLECYSTECTOMY;  Surgeon: Marlyce Huge, MD;  Location: [facility] ORS;  Service: General;  Laterality: N/A;  . MELANOMA EXCISION    . PACEMAKER INSERTION Left   . PROSTATECTOMY    . TONSILLECTOMY AND ADENOIDECTOMY      Prior to Admission medications   Medication Sig Start Date End Date Taking? Authorizing Provider  amLODipine (NORVASC) 10 MG tablet Take 10 mg by mouth daily. 02/10/15 02/10/16  Historical Provider, MD  aspirin EC 81 MG tablet Take 81 mg by mouth daily. 04/25/12   Historical Provider, MD  atorvastatin (LIPITOR) 10 MG tablet Take 10 mg by mouth daily.    Historical Provider, MD  cetirizine (ZYRTEC) 10 MG tablet Take 10 mg by mouth daily.    Historical Provider, MD  diazepam (VALIUM) 5 MG tablet Take 5 mg by mouth 2 (two) times daily.    Historical Provider, MD  fluticasone (FLONASE) 50 MCG/ACT  nasal spray Place 2 sprays into the nose at bedtime. 11/06/14   Historical Provider, MD  losartan (COZAAR) 50 MG tablet Take 50 mg by mouth daily.    Historical Provider, MD  omeprazole (PRILOSEC) 20 MG capsule Take 20 mg by mouth daily. 11/19/12   Historical Provider, MD  oxyCODONE-acetaminophen (PERCOCET) 10-325 MG per tablet Take 1 tablet by mouth every 6 (six) hours as needed.    Historical Provider, MD  oxyCODONE-acetaminophen (PERCOCET/ROXICET) 5-325 MG per tablet Take 1-2 tablets by mouth every 4 (four) hours as needed for moderate pain. 03/01/15   Marlyce Huge, MD  potassium chloride SA (K-DUR,KLOR-CON) 20 MEQ tablet Take 1 tablet by mouth daily. 02/13/15   Historical Provider, MD  pregabalin (LYRICA) 100 MG capsule Take 1 capsule by mouth. 01/19/15   Historical Provider, MD  senna (SENOKOT) 8.6 MG TABS tablet Take 1 tablet by mouth daily. 02/24/15   Historical Provider, MD  sertraline (ZOLOFT) 100 MG tablet Take 100 mg by mouth daily. 04/25/12   Historical Provider, MD  tamsulosin (FLOMAX) 0.4 MG CAPS capsule Take 1 capsule by mouth daily.    Historical Provider, MD  traZODone (DESYREL) 100 MG tablet Take 1 tablet by mouth at bedtime. 12/14/14   Historical Provider, MD    Allergies Metformin and related and Penicillins  Family History  Problem Relation Age of Onset  . Lung disease Mother   . Stroke Father  Social History Social History  Substance Use Topics  . Smoking status: Former Research scientist (life sciences)  . Smokeless tobacco: Never Used  . Alcohol use 1.2 oz/week    2 Shots of liquor per week    Review of Systems Constitutional: No fever/chills Eyes: No visual changes. ENT: No sore throat. Cardiovascular: Denies chest pain. Respiratory: Denies shortness of breath. Gastrointestinal: No abdominal pain.  No nausea, no vomiting.  No diarrhea.  No constipation. Genitourinary: Negative for dysuria. Musculoskeletal: Negative for back pain. Skin: Negative for rash. Neurological: Negative  for headaches, focal weakness or numbness.   10-point ROS otherwise negative.  ____________________________________________   PHYSICAL EXAM:  VITAL SIGNS: ED Triage Vitals  Enc Vitals Group     BP --      Pulse Rate 06/02/16 2337 69     Resp 06/02/16 2337 16     Temp 06/02/16 2337 98 F (36.7 C)     Temp Source 06/02/16 2337 Oral     SpO2 06/02/16 2337 98 %     Weight 06/02/16 2338 192 lb (87.1 kg)     Height 06/02/16 2338 5\' 7"  (1.702 m)     Head Circumference --      Peak Flow --      Pain Score --      Pain Loc --      Pain Edu? --      Excl. in [place]? --     Constitutional: Alert and oriented. Well appearing and in no acute distress. Eyes: Conjunctivae are normal. PERRL. EOMI. Head: Atraumatic. Ears:  Healthy appearing ear canals and TMs bilaterally Nose: No congestion/rhinnorhea. Mouth/Throat: Mucous membranes are moist.  Oropharynx non-erythematous. Neck: No stridor.  No meningeal signs.   Cardiovascular: Normal rate, regular rhythm. Good peripheral circulation. Grossly normal heart sounds. Respiratory: Normal respiratory effort.  No retractions. Lungs CTAB. Gastrointestinal: Soft and nontender. No distention.  Musculoskeletal: No lower extremity tenderness nor edema. No gross deformities of extremities. Neurologic:  Normal speech and language. No gross focal neurologic deficits are appreciated.  Skin: Generalized urticarial rash consistent with hives Psychiatric: Mood and affect are normal. Speech and behavior are normal.  ondition.  Procedures    INITIAL IMPRESSION / ASSESSMENT AND PLAN / ED COURSE  Pertinent labs & imaging results that were available during my care of the patient were reviewed by me and considered in my medical decision making (see chart for details).  Patient given Simon roll 125 mg IV, Pepcid 20 mg IV, and an additional 25 mg of Benadryl IV with complete resolution of rash. In addition patient's swelling of his lip resolved before  discharge. Patient denies any difficulty breathing or swallowing at this time. Patient will be referred to Dr. Kathyrn Sheriff. Patient will be prescribed prednisone and an EpiPen   Clinical Course    ____________________________________________  FINAL CLINICAL IMPRESSION(S) / ED DIAGNOSES  Final diagnoses:  Allergic reaction, initial encounter  Hives     MEDICATIONS GIVEN DURING THIS VISIT:  Medications  famotidine (PEPCID) IVPB 20 mg premix (20 mg Intravenous New Bag/Given 06/02/16 2351)  diphenhydrAMINE (BENADRYL) injection 25 mg (not administered)  methylPREDNISolone sodium succinate (SOLU-MEDROL) 125 mg/2 mL injection 125 mg (125 mg Intravenous Given 06/02/16 2351)     NEW OUTPATIENT MEDICATIONS STARTED DURING THIS VISIT:  New Prescriptions   No medications on file      Note:  This document was prepared using Dragon voice recognition software and may include unintentional dictation errors.    Gregor Hams, MD 06/03/16 [phone]

## 2016-07-22 IMAGING — US US ABDOMEN LIMITED
1 series · 14 of 25 positions shown · non-contrast
Comparison: CT earlier this day

CLINICAL DATA: Right upper quadrant pain for 10-12 hours.

EXAM:
US ABDOMEN LIMITED - RIGHT UPPER QUADRANT

[Series 1: us abdomen limited · 0.24mm/px · 14 of 42 slices shown]
[im 1/42]
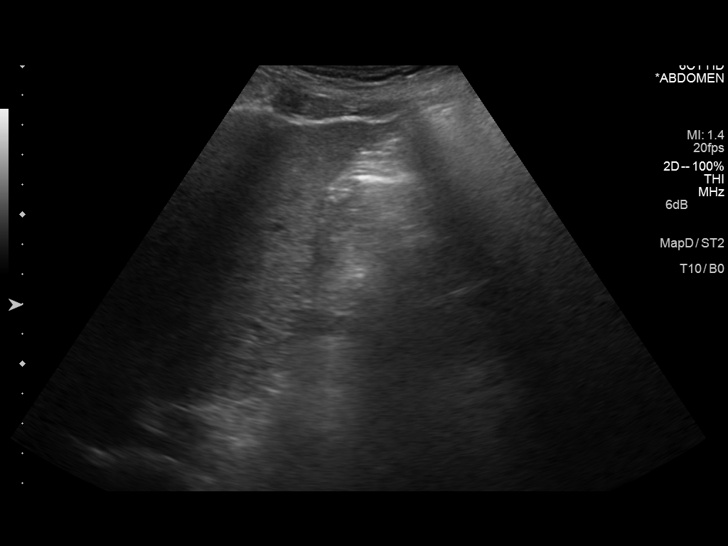
[im 4/42]
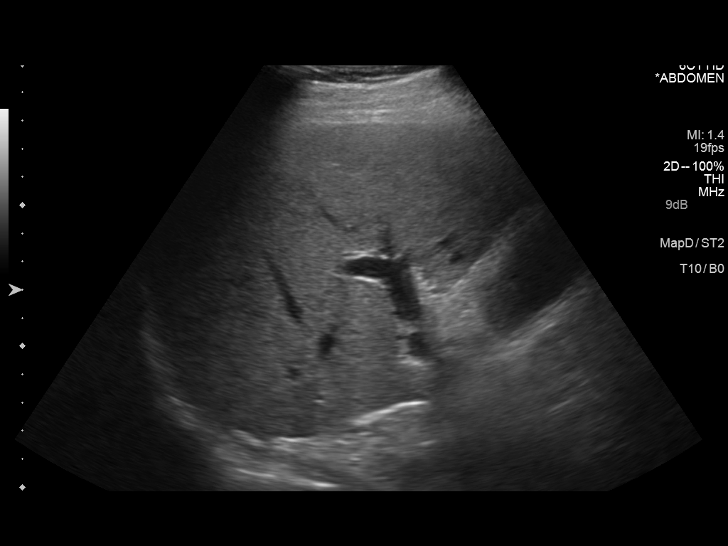
[im 7/42]
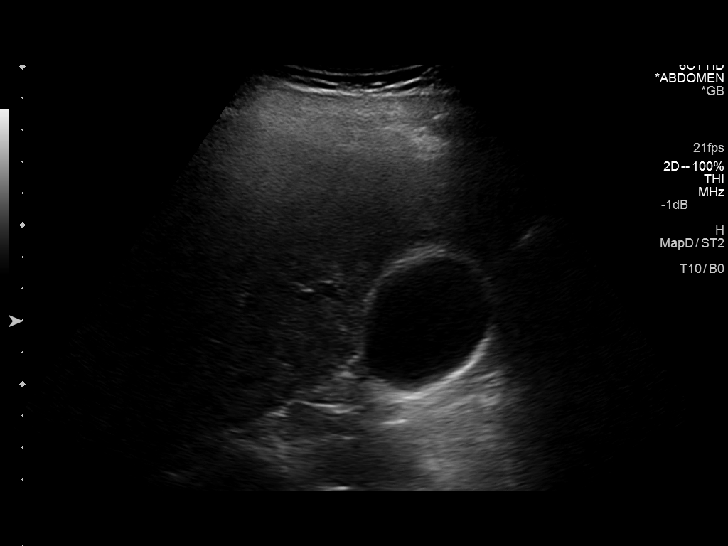
[im 11/42]
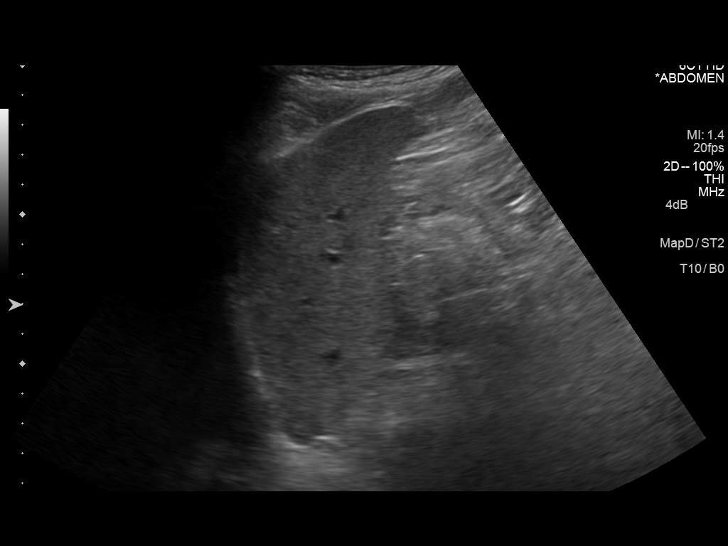
[im 14/42]
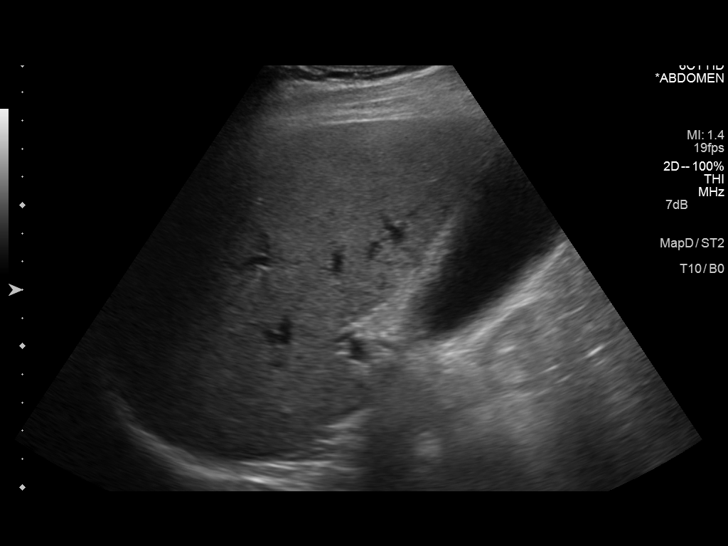
[im 16/42]
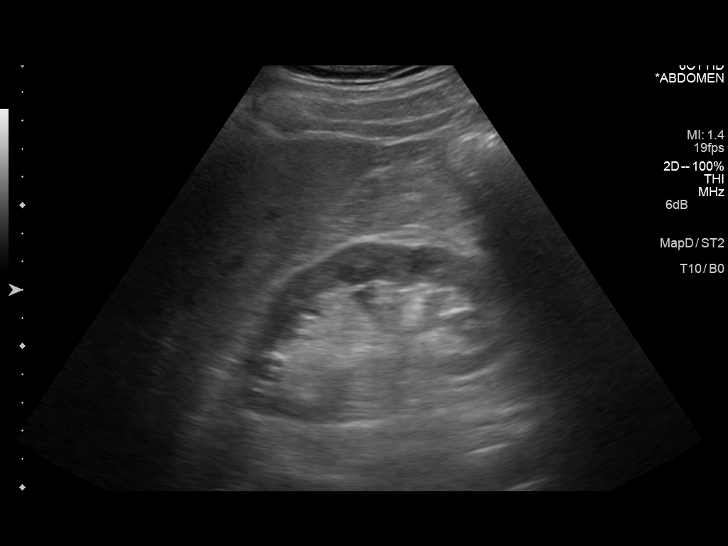
[im 19/42]
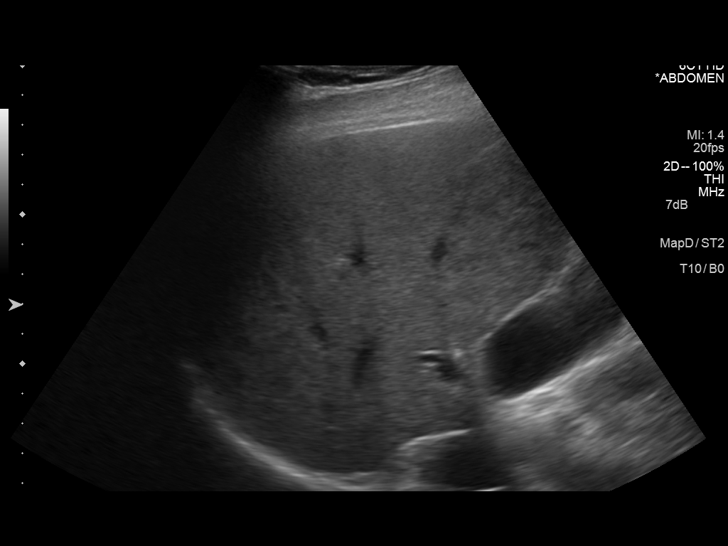
[im 23/42]
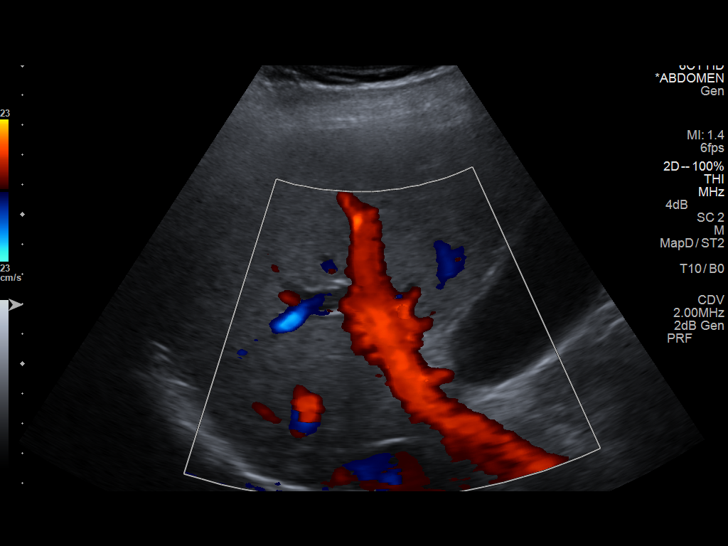
[im 26/42]
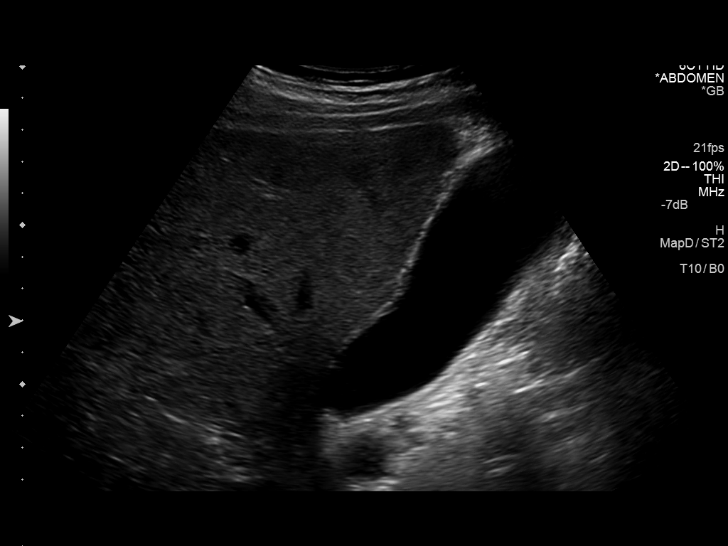
[im 28/42]
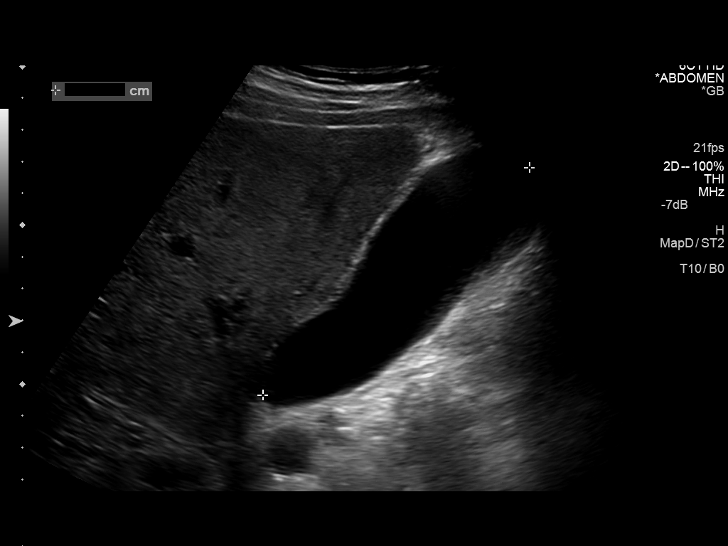
[im 31/42]
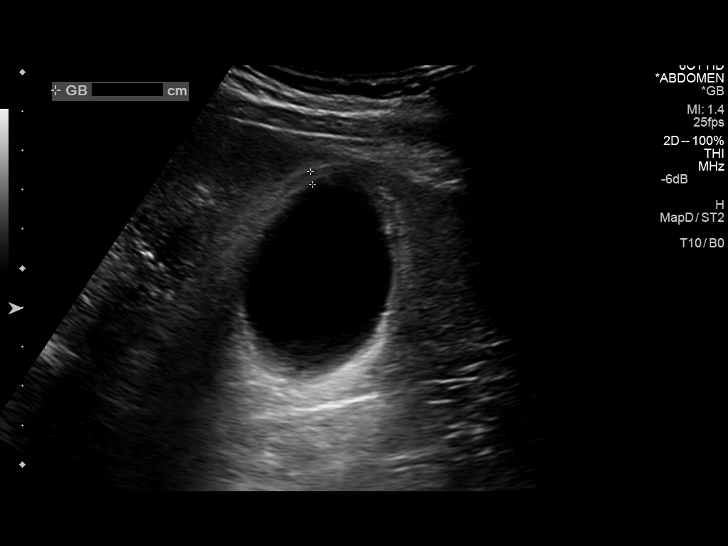
[im 35/42]
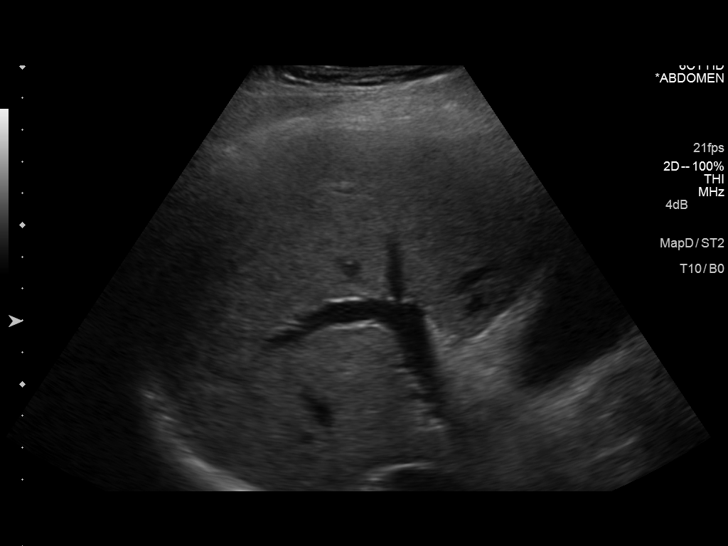
[im 38/42]
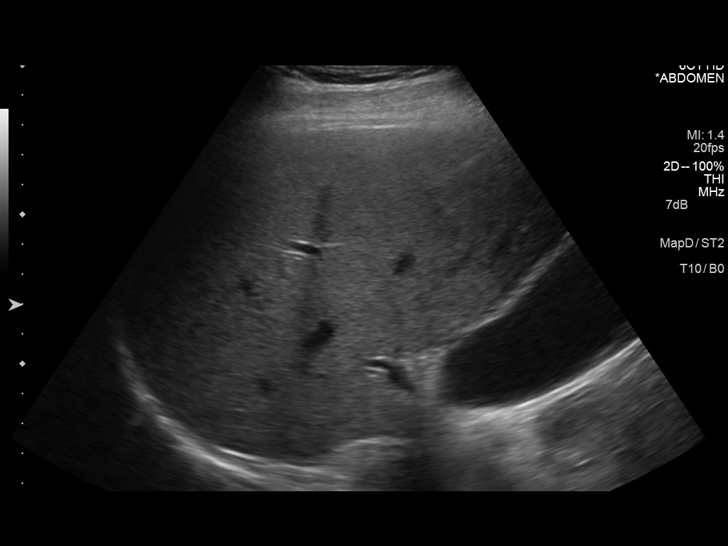
[im 42/42]
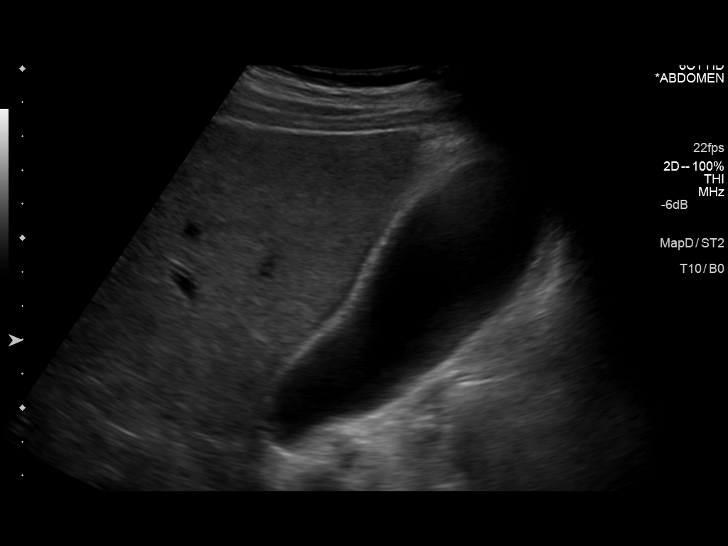

[14 of 25 positions shown; findings below may reference images not displayed]

FINDINGS: Gallbladder:

The gallbladder is distended measuring 11 cm in length. There is
gallbladder wall thickening of 3.3 mm. No gallstones or sludge.
Sonographic Murphy sign is positive.

Common bile duct:

Diameter: Prominent at 6.5 mm, upper limits of normal for age.

Liver:

No focal lesion identified. Borderline diffusely increased in
parenchymal echogenicity.
IMPRESSION: Distended gallbladder with wall thickening and positive sonographic
Murphy sign. Findings suggest acute acalculous cholecystitis. No
gallstones are seen.

## 2017-12-12 ENCOUNTER — Emergency Department
Admission: EM | Admit: 2017-12-12 | Discharge: 2017-12-12 | Disposition: A | Discharge status: Home / Self Care | Payer: Medicare Other | Attending: Emergency Medicine | Admitting: Emergency Medicine

## 2017-12-12 ENCOUNTER — Emergency Department: Payer: Medicare Other

## 2017-12-12 ENCOUNTER — Encounter: Payer: Self-pay | Admitting: Medical Oncology

## 2017-12-12 DIAGNOSIS — F418 Other specified anxiety disorders: Secondary | ICD-10-CM

## 2017-12-12 DIAGNOSIS — Z9049 Acquired absence of other specified parts of digestive tract: Secondary | ICD-10-CM | POA: Insufficient documentation

## 2017-12-12 DIAGNOSIS — M791 Myalgia, unspecified site: Secondary | ICD-10-CM | POA: Diagnosis present

## 2017-12-12 DIAGNOSIS — I10 Essential (primary) hypertension: Secondary | ICD-10-CM | POA: Insufficient documentation

## 2017-12-12 DIAGNOSIS — Z7982 Long term (current) use of aspirin: Secondary | ICD-10-CM | POA: Insufficient documentation

## 2017-12-12 DIAGNOSIS — Z87891 Personal history of nicotine dependence: Secondary | ICD-10-CM | POA: Diagnosis not present

## 2017-12-12 DIAGNOSIS — F419 Anxiety disorder, unspecified: Secondary | ICD-10-CM | POA: Diagnosis not present

## 2017-12-12 DIAGNOSIS — F329 Major depressive disorder, single episode, unspecified: Secondary | ICD-10-CM | POA: Diagnosis not present

## 2017-12-12 DIAGNOSIS — J069 Acute upper respiratory infection, unspecified: Secondary | ICD-10-CM | POA: Insufficient documentation

## 2017-12-12 DIAGNOSIS — Z85828 Personal history of other malignant neoplasm of skin: Secondary | ICD-10-CM | POA: Insufficient documentation

## 2017-12-12 DIAGNOSIS — Z79899 Other long term (current) drug therapy: Secondary | ICD-10-CM | POA: Diagnosis not present

## 2017-12-12 DIAGNOSIS — Z950 Presence of cardiac pacemaker: Secondary | ICD-10-CM | POA: Diagnosis not present

## 2017-12-12 DIAGNOSIS — E119 Type 2 diabetes mellitus without complications: Secondary | ICD-10-CM | POA: Diagnosis not present

## 2017-12-12 HISTORY — DX: Post-traumatic stress disorder, unspecified: F43.10

## 2017-12-12 HISTORY — DX: Type 2 diabetes mellitus without complications: E11.9

## 2017-12-12 LAB — CBC WITH DIFFERENTIAL/PLATELET
BASOS PCT: 1 %
Basophils Absolute: 0 10*3/uL (ref 0–0.1)
Eosinophils Absolute: 0.1 10*3/uL (ref 0–0.7)
Eosinophils Relative: 2 %
HCT: 46.4 % (ref 40.0–52.0)
Hemoglobin: 16 g/dL (ref 13.0–18.0)
Lymphocytes Relative: 24 %
Lymphs Abs: 1.2 10*3/uL (ref 1.0–3.6)
MCH: 31.7 pg (ref 26.0–34.0)
MCHC: 34.6 g/dL (ref 32.0–36.0)
MCV: 91.8 fL (ref 80.0–100.0)
Monocytes Absolute: 0.3 10*3/uL (ref 0.2–1.0)
Monocytes Relative: 7 %
NEUTROS ABS: 3.3 10*3/uL (ref 1.4–6.5)
Neutrophils Relative %: 66 %
Platelets: 140 10*3/uL — ABNORMAL LOW (ref 150–440)
RBC: 5.06 MIL/uL (ref 4.40–5.90)
RDW: 13.4 % (ref 11.5–14.5)
WBC: 4.9 10*3/uL (ref 3.8–10.6)

## 2017-12-12 LAB — URINALYSIS, COMPLETE (UACMP) WITH MICROSCOPIC
Bacteria, UA: NONE SEEN
Bilirubin Urine: NEGATIVE
GLUCOSE, UA: NEGATIVE mg/dL
Hgb urine dipstick: NEGATIVE
KETONES UR: NEGATIVE mg/dL
Leukocytes, UA: NEGATIVE
Nitrite: NEGATIVE
Protein, ur: NEGATIVE mg/dL
RBC / HPF: NONE SEEN RBC/hpf (ref 0–5)
Specific Gravity, Urine: 1.008 (ref 1.005–1.030)
pH: 6 (ref 5.0–8.0)

## 2017-12-12 LAB — COMPREHENSIVE METABOLIC PANEL
ALBUMIN: 4.5 g/dL (ref 3.5–5.0)
ALT: 55 U/L (ref 17–63)
ANION GAP: 10 (ref 5–15)
AST: 43 U/L — ABNORMAL HIGH (ref 15–41)
Alkaline Phosphatase: 121 U/L (ref 38–126)
BUN: 9 mg/dL (ref 6–20)
CALCIUM: 9 mg/dL (ref 8.9–10.3)
CHLORIDE: 102 mmol/L (ref 101–111)
CO2: 27 mmol/L (ref 22–32)
Creatinine, Ser: 0.71 mg/dL (ref 0.61–1.24)
GFR calc non Af Amer: >60 mL/min (ref >60)
GLUCOSE: 131 mg/dL — AB (ref 65–99)
Potassium: 3.6 mmol/L (ref 3.5–5.1)
SODIUM: 139 mmol/L (ref 135–145)
Total Bilirubin: 0.5 mg/dL (ref 0.3–1.2)
Total Protein: 7.2 g/dL (ref 6.5–8.1)

## 2017-12-12 LAB — CK: Total CK: 34 U/L — ABNORMAL LOW (ref 49–397)

## 2017-12-12 LAB — TROPONIN I

## 2017-12-12 MED ORDER — SODIUM CHLORIDE 0.9 % IV BOLUS (SEPSIS)
1000.0000 mL | Freq: Once | INTRAVENOUS | Status: AC
  Administered 2017-12-12: 1000 mL via INTRAVENOUS

## 2017-12-12 MED ORDER — AZITHROMYCIN 250 MG PO TABS: ORAL_TABLET | ORAL | 0 refills | Status: DC

## 2017-12-12 MED ORDER — ONDANSETRON 4 MG PO TBDP: 4.0000 mg | ORAL_TABLET | Freq: Three times a day (TID) | ORAL | 0 refills | Status: DC | PRN

## 2017-12-12 MED ORDER — HYDROXYZINE HCL 25 MG PO TABS: 25.0000 mg | ORAL_TABLET | Freq: Three times a day (TID) | ORAL | 0 refills | Status: DC | PRN

## 2017-12-12 MED ORDER — ALBUTEROL SULFATE HFA 108 (90 BASE) MCG/ACT IN AERS: 2.0000 | INHALATION_SPRAY | RESPIRATORY_TRACT | 1 refills | Status: AC | PRN

## 2017-12-12 NOTE — ED Provider Notes (Signed)
[facility] Emergency Department Provider Note  ____________________________________________   None    (approximate)  I have reviewed the triage vital signs and the nursing notes.   HISTORY  Chief Complaint Chills and Generalized Body Aches   HPI David Christensen is a 67 y.o. male who presents to the emergency department for evaluation and treatment of body aches, hot flashes, chills, sweating, nausea, weakness, inability to sleep, panic/anxiety, and heaviness in the chest with occasional cough.  He was evaluated by his primary care provider a week ago and diagnosed with influenza.  He has not improved since that time and states that the weakness continues to get worse.  Past Medical History:  Diagnosis Date  . Acid reflux   . Cancer ([place])    skin cancer  . Depression   . Diabetes mellitus without complication ([place])   . Hypertension   . PTSD (post-traumatic stress disorder)     Patient Active Problem List   Diagnosis Date Noted  . Cholecystitis 02/27/2015  . Abdominal pain 02/26/2015    Past Surgical History:  Procedure Laterality Date  . BACK SURGERY    . CHOLECYSTECTOMY N/A 02/28/2015   Procedure: LAPAROSCOPIC CHOLECYSTECTOMY;  Surgeon: Marlyce Huge, MD;  Location: [facility] ORS;  Service: General;  Laterality: N/A;  . MELANOMA EXCISION    . PACEMAKER INSERTION Left   . PROSTATECTOMY    . TONSILLECTOMY AND ADENOIDECTOMY      Prior to Admission medications   Medication Sig Start Date End Date Taking? Authorizing Provider  morphine (MS CONTIN) 15 MG 12 hr tablet Take 15 mg by mouth every 12 (twelve) hours.   Yes [provider]  albuterol (PROVENTIL HFA;VENTOLIN HFA) 108 (90 Base) MCG/ACT inhaler Inhale 2 puffs into the lungs every 4 (four) hours as needed for wheezing or shortness of breath. 12/12/17   Oneita Allmon B, FNP  amLODipine (NORVASC) 10 MG tablet Take 10 mg by mouth daily. 02/10/15 02/10/16  [provider]  aspirin  EC 81 MG tablet Take 81 mg by mouth daily. 04/25/12   [provider]  atorvastatin (LIPITOR) 10 MG tablet Take 10 mg by mouth daily.    [provider]  azithromycin (ZITHROMAX) 250 MG tablet 2 tablets today, then 1 tablet for the next 4 days. 12/12/17   Lyden Redner B, FNP  cetirizine (ZYRTEC) 10 MG tablet Take 10 mg by mouth daily.    [provider]  diazepam (VALIUM) 5 MG tablet Take 5 mg by mouth 2 (two) times daily.    [provider]  fluticasone (FLONASE) 50 MCG/ACT nasal spray Place 2 sprays into the nose at bedtime. 11/06/14   [provider]  hydrOXYzine (ATARAX/VISTARIL) 25 MG tablet Take 1 tablet (25 mg total) by mouth 3 (three) times daily as needed for anxiety. 12/12/17   Raghad Lorenz B, FNP  losartan (COZAAR) 50 MG tablet Take 50 mg by mouth daily.    [provider]  omeprazole (PRILOSEC) 20 MG capsule Take 20 mg by mouth daily. 11/19/12   [provider]  ondansetron (ZOFRAN-ODT) 4 MG disintegrating tablet Take 1 tablet (4 mg total) by mouth every 8 (eight) hours as needed for nausea or vomiting. 12/12/17   Zakeria Kulzer, Johnette Abraham B, FNP  oxyCODONE-acetaminophen (PERCOCET) 10-325 MG per tablet Take 1 tablet by mouth every 6 (six) hours as needed.    [provider]  oxyCODONE-acetaminophen (PERCOCET/ROXICET) 5-325 MG per tablet Take 1-2 tablets by mouth every 4 (four) hours as needed  for moderate pain. 03/01/15   Marlyce Huge, MD  potassium chloride SA (K-DUR,KLOR-CON) 20 MEQ tablet Take 1 tablet by mouth daily. 02/13/15   [provider]  pregabalin (LYRICA) 100 MG capsule Take 1 capsule by mouth. 01/19/15   [provider]  senna (SENOKOT) 8.6 MG TABS tablet Take 1 tablet by mouth daily. 02/24/15   [provider]  sertraline (ZOLOFT) 100 MG tablet Take 100 mg by mouth daily. 04/25/12   [provider]  tamsulosin (FLOMAX) 0.4 MG CAPS capsule Take 1 capsule by mouth daily.     [provider]  traZODone (DESYREL) 100 MG tablet Take 1 tablet by mouth at bedtime. 12/14/14   [provider]    Allergies Metformin and related and Penicillins  Family History  Problem Relation Age of Onset  . Lung disease Mother   . Stroke Father     Social History Social History   Tobacco Use  . Smoking status: Former Research scientist (life sciences)  . Smokeless tobacco: Never Used  Substance Use Topics  . Alcohol use: Yes    Alcohol/week: 1.2 oz    Types: 2 Shots of liquor per week  . Drug use: No    Review of Systems Constitutional: No fever/ positive for chills.  Positive for malaise and generalized weakness. Eyes: No visual changes. ENT: No sore throat. Cardiovascular: Denies chest pain.  Positive for chest "heaviness." Respiratory: Denies shortness of breath.  Positive for "irregular breathing." Gastrointestinal: No abdominal pain.  Positive for nausea, no vomiting.  No diarrhea.  No hematochezia.  No constipation Genitourinary: Negative for dysuria. Musculoskeletal: Negative for back pain. Skin: Negative for rash. Neurological: Positive for headaches, negative for focal weakness or numbness. Psychiatric:Positive for anxiety   ____________________________________________   PHYSICAL EXAM:  VITAL SIGNS: ED Triage Vitals  Enc Vitals Group     BP 12/12/17 0720 139/89     Pulse Rate 12/12/17 0720 78     Resp 12/12/17 0720 18     Temp 12/12/17 0720 (!) 97.3 F (36.3 C)     Temp Source 12/12/17 0720 Oral     SpO2 12/12/17 0720 99 %     Weight 12/12/17 0721 169 lb (76.7 kg)     Height 12/12/17 0721 5\' 8"  (1.727 m)     Head Circumference --      Peak Flow --      Pain Score 12/12/17 0720 0     Pain Loc --      Pain Edu? --      Excl. in [place]? --     Constitutional: Alert and oriented. Well appearing and in no acute distress. Eyes: Conjunctivae are normal. PERRL. EOMI. Head: Atraumatic. Nose: No congestion/rhinnorhea. Mouth/Throat: Mucous membranes are  moist.  Oropharynx non-erythematous. Neck: No stridor.   Cardiovascular: Normal rate, regular rhythm. Grossly normal heart sounds.  Good peripheral circulation. Respiratory: Normal respiratory effort.  No retractions. Lungs CTAB. Gastrointestinal: Soft and nontender. No distention. No abdominal bruits. No CVA tenderness. Musculoskeletal: No lower extremity tenderness nor edema.  No joint effusions. Neurologic:  Normal speech and language. No gross focal neurologic deficits are appreciated. No gait instability. Skin:  Skin is warm, dry and intact. No rash noted. Psychiatric: Mood and affect are normal. Speech and behavior are normal.  ____________________________________________   LABS (all labs ordered are listed, but only abnormal results are displayed)  Labs Reviewed  COMPREHENSIVE METABOLIC PANEL - Abnormal; Notable for the following components:      Result Value  Glucose, Bld 131 (*)    AST 43 (*)    All other components within normal limits  CBC WITH DIFFERENTIAL/PLATELET - Abnormal; Notable for the following components:   Platelets 140 (*)    All other components within normal limits  CK - Abnormal; Notable for the following components:   Total CK 34 (*)    All other components within normal limits  URINALYSIS, COMPLETE (UACMP) WITH MICROSCOPIC - Abnormal; Notable for the following components:   Color, Urine YELLOW (*)    APPearance CLEAR (*)    Squamous Epithelial / LPF 0-5 (*)    All other components within normal limits  TROPONIN I   ____________________________________________  EKG  Atrial paced rhythm with prolonged AV conduction with a ventricular rate of 68, no ectopy. ____________________________________________  RADIOLOGY  ED MD interpretation: Chronic bronchitic changes with out any focal consolidation.  Official radiology report(s): Dg Chest 2 View  Result Date: 12/12/2017 CLINICAL DATA:  Onset of malaise 16 days ago associated with body aches and  chills. Diagnosed with the flu last week. Persistent symptoms including nausea. History of diabetes, hypertension, former smoker. EXAM: CHEST  2 VIEW COMPARISON:  Portable chest x-ray of April 30, 2012 FINDINGS: The lungs are well-expanded. The interstitial markings are mildly prominent. There is no alveolar infiltrate or pleural effusion. The heart and pulmonary vascularity are normal. The ICD is in stable position. The trachea is midline. The bony thorax exhibits no acute abnormality. IMPRESSION: Mild chronic bronchitic-smoking related changes. No pneumonia, CHF, nor other acute cardiopulmonary abnormality. Electronically Signed   By: David  Martinique M.D.   On: 12/12/2017 10:10    ____________________________________________   PROCEDURES  Procedure(s) performed: None  Procedures  Critical Care performed: No  ____________________________________________   INITIAL IMPRESSION / ASSESSMENT AND PLAN / ED COURSE  As part of my medical decision making, I reviewed the following data within the electronic MEDICAL RECORD [id number] year old male presenting to the emergency department for evaluation and treatment of generalized feeling of malaise and fatigue for over 2 weeks.  Patient states that he has chronic anxiety and has panic attacks related to the possibility of something being "really wrong with me."  Images, labs, and exam are reassuring.  Patient states that his panic and anxiety has significantly improved since being here.  Results were reviewed with the patient and he states that he feels reassured.  He was encouraged to discuss his chronic panic/anxiety with his primary care provider who may be able to start him on a daily medication.  Here, he will receive a prescription for Atarax to be taken up to 3 times per day for anxiety.  Because he has felt ill for more than 2 weeks and states that he believes that he is getting worse, we will cover for any atypical bacteria that may not be shown in the chest  x-ray especially since he does smoke cigarettes.  He was also given a prescription for an albuterol inhaler for the occasional shortness of breath and Zofran to help with the nausea.  He is advised that he should call and schedule a follow-up appointment with his primary care provider next week.  He was encouraged to return to the emergency department for symptoms of change or worsen if he is unable to schedule an appointment.      ____________________________________________   FINAL CLINICAL IMPRESSION(S) / ED DIAGNOSES  Final diagnoses:  Anxiety about health  Upper respiratory tract infection, unspecified type     ED Discharge Orders  Ordered    azithromycin (ZITHROMAX) 250 MG tablet     12/12/17 1056    hydrOXYzine (ATARAX/VISTARIL) 25 MG tablet  3 times daily PRN     12/12/17 1056    ondansetron (ZOFRAN-ODT) 4 MG disintegrating tablet  Every 8 hours PRN     12/12/17 1056    albuterol (PROVENTIL HFA;VENTOLIN HFA) 108 (90 Base) MCG/ACT inhaler  Every 4 hours PRN     12/12/17 1056       Note:  This document was prepared using Dragon voice recognition software and may include unintentional dictation errors.    Victorino Dike, FNP 12/12/17 1615    Earleen Newport, MD 12/13/17 [phone]

## 2017-12-12 NOTE — ED Notes (Signed)
See triage note  States he began feeling bad about 16 days ago  Body aches,hot flashes and chills   Was seen by PCP last week and dx'd with the flu  States he doesn't feel any better denies fever  Positive nausea  Feels weak  States he is not able to sleep  Afebrile on arrival   Still feeling nervous.

## 2017-12-12 NOTE — ED Provider Notes (Signed)
EKG interpreted by me, atrial paced rhythm with prolonged AV conduction, rate is 68 bpm. Medical screening examination/treatment/procedure(s) were performed by non-physician practitioner and as supervising physician I was immediately available for consultation/collaboration.      Earleen Newport, MD 12/12/17 [phone]

## 2017-12-12 NOTE — ED Triage Notes (Addendum)
Pt reports body aches and chills states that his daughter recently diagnosed with flu. Denies other sx's.

## 2019-12-08 ENCOUNTER — Emergency Department
Admission: EM | Admit: 2019-12-08 | Discharge: 2019-12-08 | Disposition: A | Discharge status: Home / Self Care | Payer: Medicare PPO | Attending: Emergency Medicine | Admitting: Emergency Medicine

## 2019-12-08 ENCOUNTER — Encounter: Payer: Self-pay | Admitting: Emergency Medicine

## 2019-12-08 ENCOUNTER — Emergency Department: Payer: Medicare PPO

## 2019-12-08 DIAGNOSIS — Z87891 Personal history of nicotine dependence: Secondary | ICD-10-CM | POA: Diagnosis not present

## 2019-12-08 DIAGNOSIS — Y939 Activity, unspecified: Secondary | ICD-10-CM | POA: Insufficient documentation

## 2019-12-08 DIAGNOSIS — X501XXA Overexertion from prolonged static or awkward postures, initial encounter: Secondary | ICD-10-CM | POA: Diagnosis not present

## 2019-12-08 DIAGNOSIS — Y929 Unspecified place or not applicable: Secondary | ICD-10-CM | POA: Diagnosis not present

## 2019-12-08 DIAGNOSIS — I10 Essential (primary) hypertension: Secondary | ICD-10-CM | POA: Diagnosis not present

## 2019-12-08 DIAGNOSIS — Y999 Unspecified external cause status: Secondary | ICD-10-CM | POA: Insufficient documentation

## 2019-12-08 DIAGNOSIS — E119 Type 2 diabetes mellitus without complications: Secondary | ICD-10-CM | POA: Insufficient documentation

## 2019-12-08 DIAGNOSIS — Z79899 Other long term (current) drug therapy: Secondary | ICD-10-CM | POA: Diagnosis not present

## 2019-12-08 DIAGNOSIS — M25561 Pain in right knee: Secondary | ICD-10-CM | POA: Insufficient documentation

## 2019-12-08 MED ORDER — MELOXICAM 7.5 MG PO TABS: 7.5000 mg | ORAL_TABLET | Freq: Every day | ORAL | 0 refills | Status: AC

## 2019-12-08 NOTE — Discharge Instructions (Signed)
Keep your appointment with [facility] this week.  Ice and elevation to decrease any swelling and help with pain.  Wear the knee immobilizer to see if this helps with protection and support of your knee.  Use your walker when you are up walking.  You do not have to wear the knee immobilizer while you are sleeping.  The meloxicam 7.5 is 1 a day with food.  Discontinue taking this medication if you begin having stomach upset, tarry black stools or any bleeding.  Return to the emergency department if any severe worsening of your symptoms or urgent concerns.

## 2019-12-08 NOTE — ED Provider Notes (Signed)
[facility] Emergency Department Provider Note  ____________________________________________   First MD Initiated Contact with Patient 12/08/19 1048     (approximate)  I have reviewed the triage vital signs and the nursing notes.   HISTORY  Chief Complaint Leg Injury   HPI David Christensen is a 69 y.o. male to the ED via EMS with complaint of right knee pain.  Patient states that he twisted his knee 5 days ago and has an appointment Tuesday at [facility] for evaluation.  Patient states that he twisted it again while walking this morning.  He denies any fall or direct trauma.  Patient states that it is painful to bear weight at this time.     Past Medical History:  Diagnosis Date  . Acid reflux   . Cancer ([place])    skin cancer  . Depression   . Diabetes mellitus without complication ([place])   . Hypertension   . PTSD (post-traumatic stress disorder)     Patient Active Problem List   Diagnosis Date Noted  . Cholecystitis 02/27/2015  . Abdominal pain 02/26/2015    Past Surgical History:  Procedure Laterality Date  . BACK SURGERY    . CHOLECYSTECTOMY N/A 02/28/2015   Procedure: LAPAROSCOPIC CHOLECYSTECTOMY;  Surgeon: Marlyce Huge, MD;  Location: [facility] ORS;  Service: General;  Laterality: N/A;  . MELANOMA EXCISION    . PACEMAKER INSERTION Left   . PROSTATECTOMY    . TONSILLECTOMY AND ADENOIDECTOMY      Prior to Admission medications   Medication Sig Start Date End Date Taking? Authorizing Provider  albuterol (PROVENTIL HFA;VENTOLIN HFA) 108 (90 Base) MCG/ACT inhaler Inhale 2 puffs into the lungs every 4 (four) hours as needed for wheezing or shortness of breath. 12/12/17   Triplett, Cari B, FNP  amLODipine (NORVASC) 10 MG tablet Take 10 mg by mouth daily. 02/10/15 02/10/16  [provider]  aspirin EC 81 MG tablet Take 81 mg by mouth daily. 04/25/12   [provider]  atorvastatin (LIPITOR) 10 MG tablet Take 10 mg by mouth daily.     [provider]  cetirizine (ZYRTEC) 10 MG tablet Take 10 mg by mouth daily.    [provider]  diazepam (VALIUM) 5 MG tablet Take 5 mg by mouth 2 (two) times daily.    [provider]  fluticasone (FLONASE) 50 MCG/ACT nasal spray Place 2 sprays into the nose at bedtime. 11/06/14   [provider]  losartan (COZAAR) 50 MG tablet Take 50 mg by mouth daily.    [provider]  meloxicam (MOBIC) 7.5 MG tablet Take 1 tablet (7.5 mg total) by mouth daily. 12/08/19 12/07/20  Johnn Hai, PA-C  morphine (MS CONTIN) 15 MG 12 hr tablet Take 15 mg by mouth every 12 (twelve) hours.    [provider]  omeprazole (PRILOSEC) 20 MG capsule Take 20 mg by mouth daily. 11/19/12   [provider]  oxyCODONE-acetaminophen (PERCOCET) 10-325 MG per tablet Take 1 tablet by mouth every 6 (six) hours as needed.    [provider]  oxyCODONE-acetaminophen (PERCOCET/ROXICET) 5-325 MG per tablet Take 1-2 tablets by mouth every 4 (four) hours as needed for moderate pain. 03/01/15   Marlyce Huge, MD  potassium chloride SA (K-DUR,KLOR-CON) 20 MEQ tablet Take 1 tablet by mouth daily. 02/13/15   [provider]  pregabalin (LYRICA) 100 MG capsule Take 1 capsule by mouth. 01/19/15   [provider]  senna (SENOKOT) 8.6 MG TABS tablet Take  1 tablet by mouth daily. 02/24/15   [provider]  sertraline (ZOLOFT) 100 MG tablet Take 100 mg by mouth daily. 04/25/12   [provider]  tamsulosin (FLOMAX) 0.4 MG CAPS capsule Take 1 capsule by mouth daily.    [provider]  traZODone (DESYREL) 100 MG tablet Take 1 tablet by mouth at bedtime. 12/14/14   [provider]    Allergies Metformin and related and Penicillins  Family History  Problem Relation Age of Onset  . Lung disease Mother   . Stroke Father     Social History Social History   Tobacco Use  . Smoking status: Former Research scientist (life sciences)  .  Smokeless tobacco: Never Used  Substance Use Topics  . Alcohol use: Yes    Alcohol/week: 2.0 standard drinks    Types: 2 Shots of liquor per week  . Drug use: No    Review of Systems Constitutional: No fever/chills Eyes: No visual changes. Cardiovascular: Denies chest pain. Respiratory: Denies shortness of breath. Gastrointestinal:  No nausea, no vomiting.  No diarrhea.   Musculoskeletal: Positive for right knee pain. Skin: Negative for rash. Neurological: Negative for headaches, focal weakness or numbness. ____________________________________________   PHYSICAL EXAM:  VITAL SIGNS: ED Triage Vitals  Enc Vitals Group     BP      Pulse      Resp      Temp      Temp src      SpO2      Weight      Height      Head Circumference      Peak Flow      Pain Score      Pain Loc      Pain Edu?      Excl. in [place]?    Constitutional: Alert and oriented. Well appearing and in no acute distress. Eyes: Conjunctivae are normal. PERRL. EOMI. Head: Atraumatic. Neck: No stridor.   Cardiovascular: Normal rate, regular rhythm. Grossly normal heart sounds.  Good peripheral circulation. Respiratory: Normal respiratory effort.  No retractions. Lungs CTAB. Gastrointestinal: Soft and nontender. Musculoskeletal: On examination of the right knee there is no gross deformity or soft tissue injury present.  There is some mild diffuse tenderness on palpation anteriorly and medially.  No effusion is appreciated.  Skin is warm and dry.  Range of motion is slow and guarded secondary to discomfort. Neurologic:  Normal speech and language. No gross focal neurologic deficits are appreciated.  Skin:  Skin is warm, dry and intact. No rash noted. Psychiatric: Mood and affect are normal. Speech and behavior are normal.  ____________________________________________   LABS (all labs ordered are listed, but only abnormal results are displayed)  Labs Reviewed - No data to display RADIOLOGY   Official  radiology report(s): DG Knee Complete 4 Views Right  Result Date: 12/08/2019 CLINICAL DATA:  Twisting injury, pain EXAM: RIGHT KNEE - COMPLETE 4+ VIEW COMPARISON:  None. FINDINGS: No fracture or dislocation of the right knee. Joint spaces are preserved. Small knee joint effusion the soft tissue edema anteriorly. IMPRESSION: No fracture or dislocation of the right knee. Small knee joint effusion. Electronically Signed   By: Eddie Candle M.D.   On: 12/08/2019 11:28    ____________________________________________   PROCEDURES  Procedure(s) performed (including Critical Care):  Procedures   ____________________________________________   INITIAL IMPRESSION / ASSESSMENT AND PLAN / ED COURSE  As part of my medical decision making, I reviewed the following data within the electronic medical  record:  Notes from prior ED visits and [place] Controlled Substance Database   69 year old male presents to the ED via EMS with complaint of right knee pain after twisting his knee twice in the last 5 days.  Patient denies any direct trauma and denies a fall injury.  Patient has continued to ambulate and "very active".  He does have a walker at home that is not often used.  Trays were reassuring.  Patient has a prescription for oxycodone.  A knee immobilizer was applied and he is encouraged to use his walker until he is seen at [facility] this week.  Also prescription for meloxicam 7.5 mg 1 daily with food for inflammation.  He was advised to discontinue taking the meloxicam should he have any abdominal discomfort or black tarry stools.  ____________________________________________   FINAL CLINICAL IMPRESSION(S) / ED DIAGNOSES  Final diagnoses:  Anterior knee pain, right     ED Discharge Orders         Ordered    meloxicam (MOBIC) 7.5 MG tablet  Daily     12/08/19 1142           Note:  This document was prepared using Dragon voice recognition software and may include unintentional dictation  errors.    Johnn Hai, PA-C 12/08/19 1258    Harvest Dark, MD 12/08/19 [phone]

## 2019-12-08 NOTE — ED Triage Notes (Signed)
Pt to ED by [facility] with c/o of right knee pain. Pt states he "twisted" it about 5 days ago and again this morning while ambulating. Pt states pain with weight bearing.

## 2020-02-13 ENCOUNTER — Emergency Department
Admission: EM | Admit: 2020-02-13 | Discharge: 2020-02-13 | Disposition: A | Discharge status: Home / Self Care | Payer: Medicare PPO | Attending: Emergency Medicine | Admitting: Emergency Medicine

## 2020-02-13 ENCOUNTER — Emergency Department: Payer: Medicare PPO

## 2020-02-13 ENCOUNTER — Other Ambulatory Visit: Payer: Self-pay

## 2020-02-13 DIAGNOSIS — R519 Headache, unspecified: Secondary | ICD-10-CM | POA: Diagnosis not present

## 2020-02-13 DIAGNOSIS — J90 Pleural effusion, not elsewhere classified: Secondary | ICD-10-CM

## 2020-02-13 DIAGNOSIS — Z7984 Long term (current) use of oral hypoglycemic drugs: Secondary | ICD-10-CM | POA: Diagnosis not present

## 2020-02-13 DIAGNOSIS — R0602 Shortness of breath: Secondary | ICD-10-CM | POA: Diagnosis present

## 2020-02-13 DIAGNOSIS — Z8701 Personal history of pneumonia (recurrent): Secondary | ICD-10-CM | POA: Diagnosis not present

## 2020-02-13 DIAGNOSIS — I10 Essential (primary) hypertension: Secondary | ICD-10-CM | POA: Diagnosis not present

## 2020-02-13 DIAGNOSIS — Z20822 Contact with and (suspected) exposure to covid-19: Secondary | ICD-10-CM | POA: Diagnosis not present

## 2020-02-13 LAB — RESPIRATORY PANEL BY RT PCR (FLU A&B, COVID)
Influenza A by PCR: NEGATIVE
Influenza B by PCR: NEGATIVE
SARS Coronavirus 2 by RT PCR: NEGATIVE

## 2020-02-13 LAB — COMPREHENSIVE METABOLIC PANEL
ALT: 23 U/L (ref 0–44)
AST: 22 U/L (ref 15–41)
Albumin: 3.6 g/dL (ref 3.5–5.0)
Alkaline Phosphatase: 104 U/L (ref 38–126)
Anion gap: 8 (ref 5–15)
BUN: 10 mg/dL (ref 8–23)
CO2: 29 mmol/L (ref 22–32)
Calcium: 8.8 mg/dL — ABNORMAL LOW (ref 8.9–10.3)
Chloride: 101 mmol/L (ref 98–111)
Creatinine, Ser: 0.96 mg/dL (ref 0.61–1.24)
GFR calc Af Amer: >60 mL/min (ref >60)
GFR calc non Af Amer: >60 mL/min (ref >60)
Glucose, Bld: 288 mg/dL — ABNORMAL HIGH (ref 70–99)
Potassium: 3.8 mmol/L (ref 3.5–5.1)
Sodium: 138 mmol/L (ref 135–145)
Total Bilirubin: 0.7 mg/dL (ref 0.3–1.2)
Total Protein: 6.5 g/dL (ref 6.5–8.1)

## 2020-02-13 LAB — CBC
HCT: 49.1 % (ref 39.0–52.0)
Hemoglobin: 17.1 g/dL — ABNORMAL HIGH (ref 13.0–17.0)
MCH: 33.6 pg (ref 26.0–34.0)
MCHC: 34.8 g/dL (ref 30.0–36.0)
MCV: 96.5 fL (ref 80.0–100.0)
Platelets: 100 10*3/uL — ABNORMAL LOW (ref 150–400)
RBC: 5.09 MIL/uL (ref 4.22–5.81)
RDW: 13.6 % (ref 11.5–15.5)
WBC: 4.1 10*3/uL (ref 4.0–10.5)
nRBC: 0 % (ref 0.0–0.2)

## 2020-02-13 LAB — BLOOD GAS, VENOUS
Acid-Base Excess: 7.4 mmol/L — ABNORMAL HIGH (ref 0.0–2.0)
Bicarbonate: 36.1 mmol/L — ABNORMAL HIGH (ref 20.0–28.0)
O2 Saturation: 34.4 %
Patient temperature: 37
pCO2, Ven: 70 mmHg — ABNORMAL HIGH (ref 44.0–60.0)
pH, Ven: 7.32 (ref 7.250–7.430)
pO2, Ven: <31 mmHg — CL (ref 32.0–45.0)

## 2020-02-13 LAB — EXPECTORATED SPUTUM ASSESSMENT W REFEX TO RESP CULTURE

## 2020-02-13 LAB — EXPECTORATED SPUTUM ASSESSMENT W GRAM STAIN, RFLX TO RESP C

## 2020-02-13 LAB — BRAIN NATRIURETIC PEPTIDE: B Natriuretic Peptide: 10 pg/mL (ref 0.0–100.0)

## 2020-02-13 LAB — TROPONIN I (HIGH SENSITIVITY): Troponin I (High Sensitivity): 4 ng/L (ref <18)

## 2020-02-13 MED ORDER — IOHEXOL 350 MG/ML SOLN
75.0000 mL | Freq: Once | INTRAVENOUS | Status: AC | PRN
  Administered 2020-02-13: 75 mL via INTRAVENOUS

## 2020-02-13 MED ORDER — METHYLPREDNISOLONE SODIUM SUCC 125 MG IJ SOLR
125.0000 mg | Freq: Once | INTRAMUSCULAR | Status: AC
  Administered 2020-02-13: 125 mg via INTRAVENOUS
  Filled 2020-02-13: qty 2

## 2020-02-13 MED ORDER — PREDNISONE 10 MG (21) PO TBPK: ORAL_TABLET | Freq: Every day | ORAL | 0 refills | Status: DC

## 2020-02-13 MED ORDER — HYDROCOD POLST-CPM POLST ER 10-8 MG/5ML PO SUER: 5.0000 mL | Freq: Two times a day (BID) | ORAL | 0 refills | Status: DC

## 2020-02-13 MED ORDER — IPRATROPIUM-ALBUTEROL 0.5-2.5 (3) MG/3ML IN SOLN
3.0000 mL | Freq: Once | RESPIRATORY_TRACT | Status: AC
  Administered 2020-02-13: 3 mL via RESPIRATORY_TRACT
  Filled 2020-02-13: qty 3

## 2020-02-13 NOTE — ED Triage Notes (Signed)
Pt states he has been "battling pneumonia for 3 weeks" and has been on multiple antibiotics for it but states that "mucous feels stuck" in his lung- pt states he started to feel "swimmy headed" today and want to come be seen- pt states when he had his xray done in [place] is "showed some fluid"

## 2020-02-13 NOTE — ED Provider Notes (Signed)
[facility] Emergency Department Provider Note       Time seen: ----------------------------------------- 3:21 PM on 02/13/2020 -----------------------------------------   I have reviewed the triage vital signs and the nursing notes.  HISTORY   Chief Complaint Shortness of Breath   HPI David Christensen is a 69 y.o. male with a history of acid reflux, skin cancer, depression, diabetes, hypertension, PTSD who presents to the ED for reevaluation concerning pneumonia. Patient has been battling pneumonia for 3 weeks and has been on multiple antibiotics. Patient states mucus feels stuck in his lung. He states he started to feel lightheaded today and wanted to come be seen. He denies any pain.  Past Medical History:  Diagnosis Date  . Acid reflux   . Cancer ([place])    skin cancer  . Depression   . Diabetes mellitus without complication ([place])   . Hypertension   . PTSD (post-traumatic stress disorder)     Patient Active Problem List   Diagnosis Date Noted  . Cholecystitis 02/27/2015  . Abdominal pain 02/26/2015    Past Surgical History:  Procedure Laterality Date  . BACK SURGERY    . CHOLECYSTECTOMY N/A 02/28/2015   Procedure: LAPAROSCOPIC CHOLECYSTECTOMY;  Surgeon: Marlyce Huge, MD;  Location: [facility] ORS;  Service: General;  Laterality: N/A;  . MELANOMA EXCISION    . PACEMAKER INSERTION Left   . PROSTATECTOMY    . TONSILLECTOMY AND ADENOIDECTOMY      Allergies Metformin and related and Penicillins  Social History Social History   Tobacco Use  . Smoking status: Former Research scientist (life sciences)  . Smokeless tobacco: Never Used  Substance Use Topics  . Alcohol use: Yes    Alcohol/week: 2.0 standard drinks    Types: 2 Shots of liquor per week  . Drug use: No    Review of Systems Constitutional: Negative for fever. Cardiovascular: Negative for chest pain. Respiratory: Positive for cough Gastrointestinal: Negative for abdominal pain, vomiting and  diarrhea. Musculoskeletal: Negative for back pain. Skin: Negative for rash. Neurological: Negative for headaches, focal weakness or numbness.  All systems negative/normal/unremarkable except as stated in the HPI  ____________________________________________   PHYSICAL EXAM:  VITAL SIGNS: ED Triage Vitals  Enc Vitals Group     BP 02/13/20 1125 (!) 126/59     Pulse Rate 02/13/20 1125 78     Resp 02/13/20 1125 20     Temp 02/13/20 1125 98.1 F (36.7 C)     Temp Source 02/13/20 1125 Oral     SpO2 02/13/20 1125 92 %     Weight 02/13/20 1127 175 lb (79.4 kg)     Height 02/13/20 1127 5\' 9"  (1.753 m)     Head Circumference --      Peak Flow --      Pain Score 02/13/20 1127 0     Pain Loc --      Pain Edu? --      Excl. in [place]? --     Constitutional: Alert and oriented.  Mild distress Eyes: Conjunctivae are normal. Normal extraocular movements. Cardiovascular: Normal rate, regular rhythm. No murmurs, rubs, or gallops. Respiratory: Wheezing and rhonchi bilaterally, persistent coughing.  No obvious tachypnea Gastrointestinal: Soft and nontender. Normal bowel sounds Musculoskeletal: Nontender with normal range of motion in extremities. No lower extremity tenderness nor edema. Neurologic:  Normal speech and language. No gross focal neurologic deficits are appreciated.  Skin:  Skin is warm, dry and intact. No rash noted. Psychiatric: Mood and affect are normal. Speech and behavior are normal.  ____________________________________________  EKG: Interpreted by me. Sinus rhythm with rate of 75 bpm, normal PR interval, normal QRS, normal QT  ____________________________________________  ED COURSE:  As part of my medical decision making, I reviewed the following data within the [place] History obtained from family if available, nursing notes, old chart and ekg, as well as notes from prior ED visits. Patient presented for evaluation concerning pneumonia, we will  assess with labs and imaging as indicated at this time.   Procedures  David Christensen was evaluated in Emergency Department on 02/13/2020 for the symptoms described in the history of present illness. He was evaluated in the context of the global COVID-19 pandemic, which necessitated consideration that the patient might be at risk for infection with the SARS-CoV-2 virus that causes COVID-19. Institutional protocols and algorithms that pertain to the evaluation of patients at risk for COVID-19 are in a state of rapid change based on information released by regulatory bodies including the CDC and federal and state organizations. These policies and algorithms were followed during the patient's care in the ED.  ____________________________________________   LABS (pertinent positives/negatives)  Labs Reviewed  CBC - Abnormal; Notable for the following components:      Result Value   Hemoglobin 17.1 (*)    Platelets 100 (*)    All other components within normal limits  COMPREHENSIVE METABOLIC PANEL - Abnormal; Notable for the following components:   Glucose, Bld 288 (*)    Calcium 8.8 (*)    All other components within normal limits  BLOOD GAS, VENOUS - Abnormal; Notable for the following components:   pCO2, Ven 70 (*)    pO2, Ven <31.0 (*)    Bicarbonate 36.1 (*)    Acid-Base Excess 7.4 (*)    All other components within normal limits  RESPIRATORY PANEL BY RT PCR (FLU A&B, COVID)  EXPECTORATED SPUTUM ASSESSMENT W REFEX TO RESP CULTURE  CULTURE, RESPIRATORY  BRAIN NATRIURETIC PEPTIDE  QUANTIFERON-TB GOLD PLUS  TROPONIN I (HIGH SENSITIVITY)    RADIOLOGY Images were viewed by me Chest x-ray IMPRESSION: Slight interval improvement of patchy airspace opacity within the right lower lobe suspicious for pneumonia.  ____________________________________________   DIFFERENTIAL DIAGNOSIS   Bacterial pneumonia, bronchitis, COVID-19, COPD, CHF  FINAL ASSESSMENT AND PLAN  Pleural  effusion   Plan: The patient had presented for reevaluation of recent pneumonia. Patient's labs did not reveal any acute process initially. Patient's imaging on x-ray revealed slight interval improvement of patchy airspace opacity in the right lower lobe.  Due to his having 3 x-ray showing right lower lobe pneumonia over the last 3 weeks or so, I proceeded with CT imaging of his chest. CT of the chest revealed scarring in prior aspects his exposure with a pleural effusion.  I will see if we can get him to see a pulmonologist soon.  He has a scheduled appointment in 3 weeks but I think he needs to go before this and possibly have thoracentesis done.  He will be on a steroid taper, I do not think he needs additional antibiotics at this time.  Laurence Aly, MD    Note: This note was generated in part or whole with voice recognition software. Voice recognition is usually quite accurate but there are transcription errors that can and very often do occur. I apologize for any typographical errors that were not detected and corrected.     Earleen Newport, MD 02/13/20 [phone]

## 2020-02-15 LAB — QUANTIFERON-TB GOLD PLUS (RQFGPL)
QuantiFERON Mitogen Value: >10 IU/mL
QuantiFERON Nil Value: 0.01 IU/mL
QuantiFERON TB1 Ag Value: 0.02 IU/mL
QuantiFERON TB2 Ag Value: 0.03 IU/mL

## 2020-02-15 LAB — CULTURE, RESPIRATORY: Culture: NORMAL

## 2020-02-15 LAB — QUANTIFERON-TB GOLD PLUS: QuantiFERON-TB Gold Plus: NEGATIVE

## 2020-02-15 LAB — CULTURE, RESPIRATORY W GRAM STAIN

## 2020-04-10 ENCOUNTER — Other Ambulatory Visit: Payer: Self-pay | Admitting: Pulmonary Disease

## 2020-04-10 DIAGNOSIS — R9389 Abnormal findings on diagnostic imaging of other specified body structures: Secondary | ICD-10-CM

## 2020-04-10 DIAGNOSIS — J440 Chronic obstructive pulmonary disease with acute lower respiratory infection: Secondary | ICD-10-CM

## 2020-04-10 DIAGNOSIS — Z720 Tobacco use: Secondary | ICD-10-CM

## 2020-04-29 ENCOUNTER — Other Ambulatory Visit: Payer: Self-pay

## 2020-04-29 ENCOUNTER — Ambulatory Visit
Admission: RE | Admit: 2020-04-29 | Discharge: 2020-04-29 | Disposition: A | Discharge status: Home / Self Care | Payer: Medicare PPO | Source: Ambulatory Visit | Attending: Pulmonary Disease | Admitting: Pulmonary Disease

## 2020-04-29 DIAGNOSIS — J440 Chronic obstructive pulmonary disease with acute lower respiratory infection: Secondary | ICD-10-CM | POA: Diagnosis not present

## 2020-04-29 DIAGNOSIS — R9389 Abnormal findings on diagnostic imaging of other specified body structures: Secondary | ICD-10-CM

## 2020-04-29 DIAGNOSIS — Z720 Tobacco use: Secondary | ICD-10-CM | POA: Insufficient documentation

## 2020-04-29 MED ORDER — IOHEXOL 300 MG/ML  SOLN
75.0000 mL | Freq: Once | INTRAMUSCULAR | Status: AC | PRN
  Administered 2020-04-29: 13:00:00 75 mL via INTRAVENOUS

## 2021-09-23 IMAGING — CT CT CHEST W/ CM
2 of 4 series · 14 of 36 positions shown, 17 images · IV contrast (omnipaque)
Comparison: CT chest 02/13/2020

CLINICAL DATA: Follow-up in patient imaging

EXAM:
CT CHEST WITH CONTRAST
TECHNIQUE: Multidetector CT imaging of the chest was performed during
intravenous contrast administration.
CONTRAST:  75mL OMNIPAQUE IOHEXOL 300 MG/ML  SOLN

[Series 2: axial chest 2.00 · axial · 0.76mm/px · z∈[-1202,-886]mm · 11 of 188 slices shown, 14 images]
[im 15/188  mediastinal]
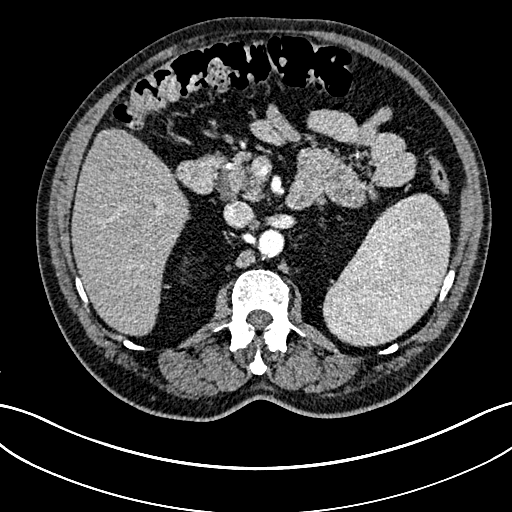
[im 15/188  lung]
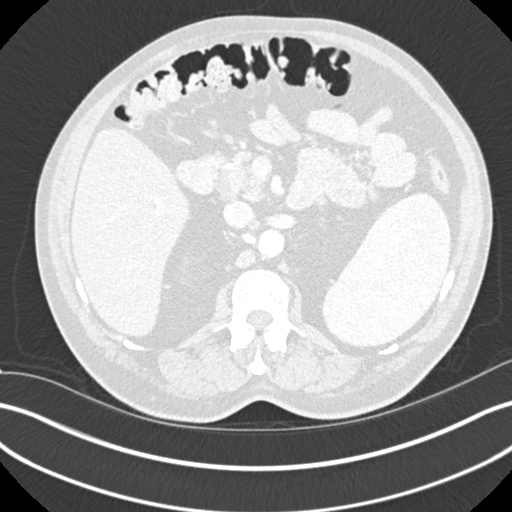
[im 29/188  lung]
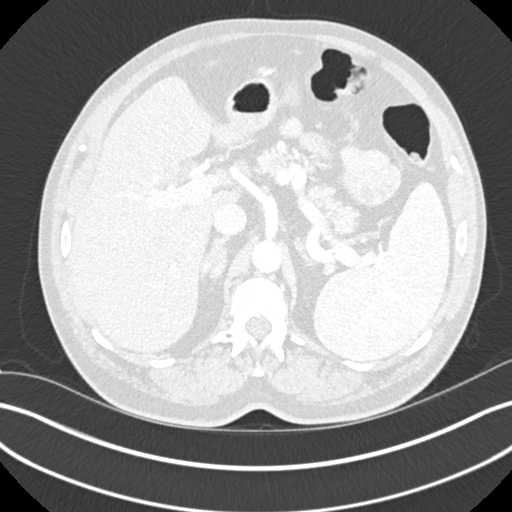
[im 44/188  lung]
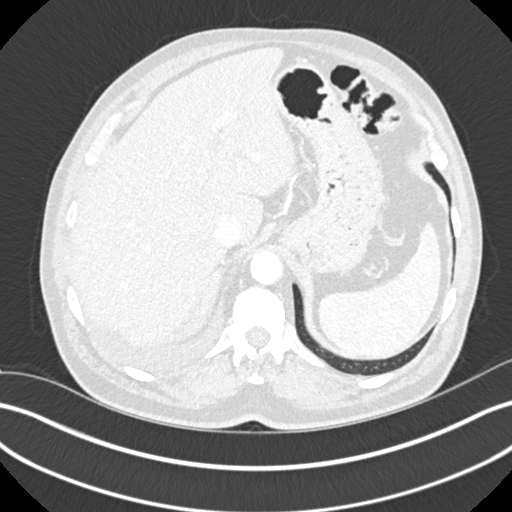
[im 58/188  lung]
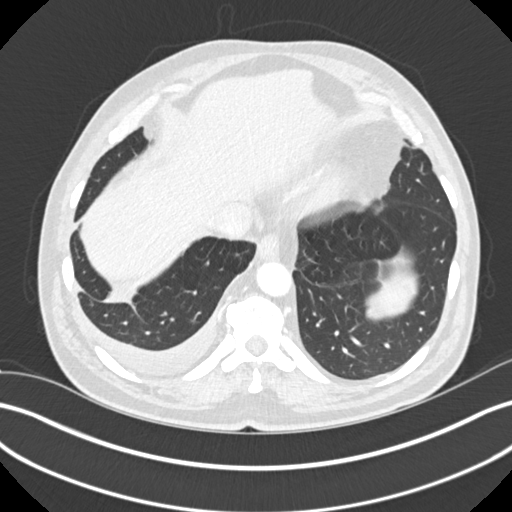
[im 72/188  mediastinal]
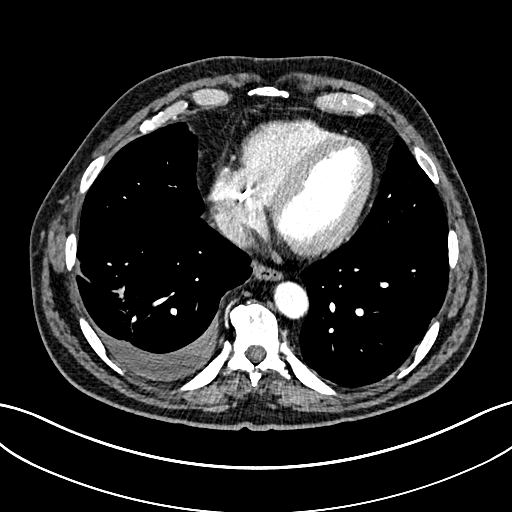
[im 72/188  lung]
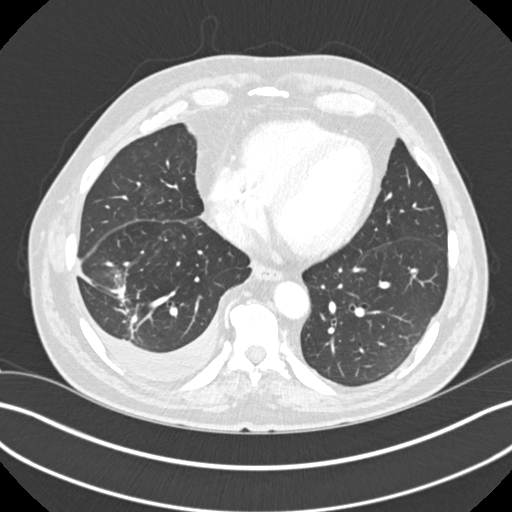
[im 101/188  lung]
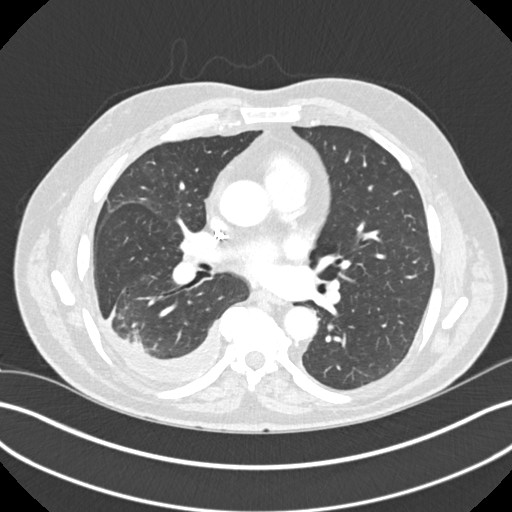
[im 116/188  lung]
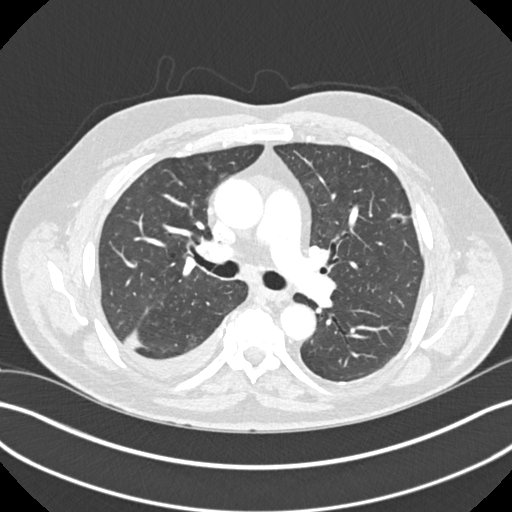
[im 130/188  lung]
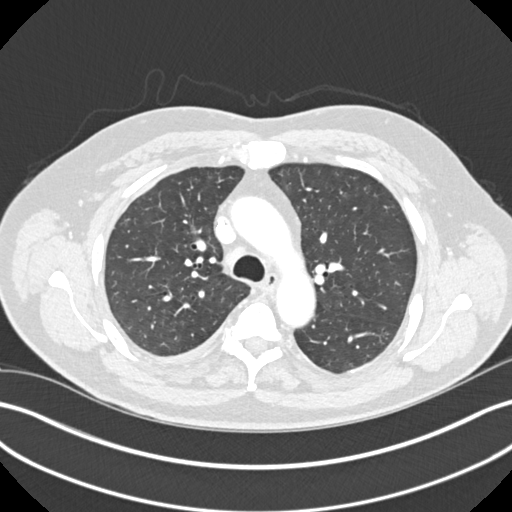
[im 144/188  mediastinal]
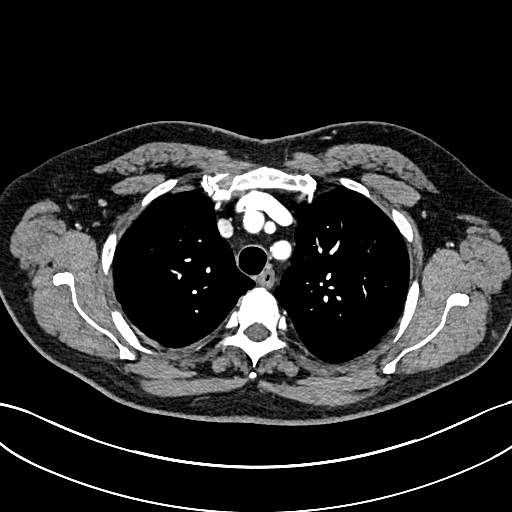
[im 144/188  lung]
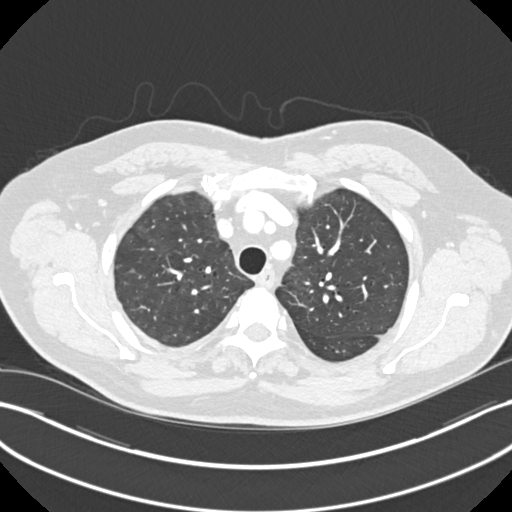
[im 159/188  lung]
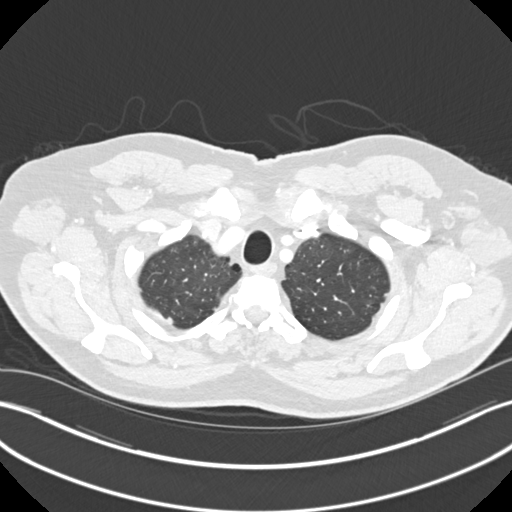
[im 173/188  lung]
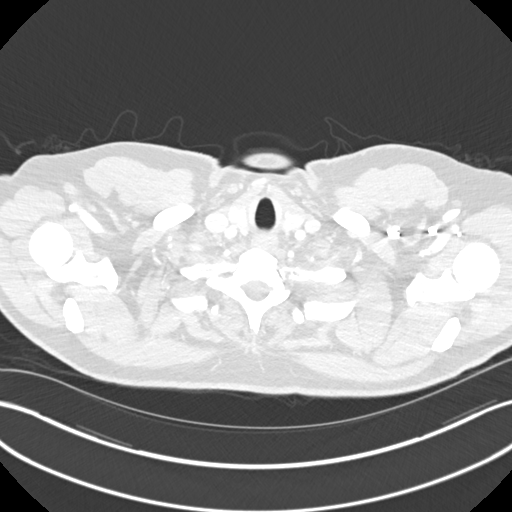

[Series 4: coronal chest 2.00 cor · coronal · 0.74mm/px · 3 of 174 slices shown]
[im 35/174  lung]
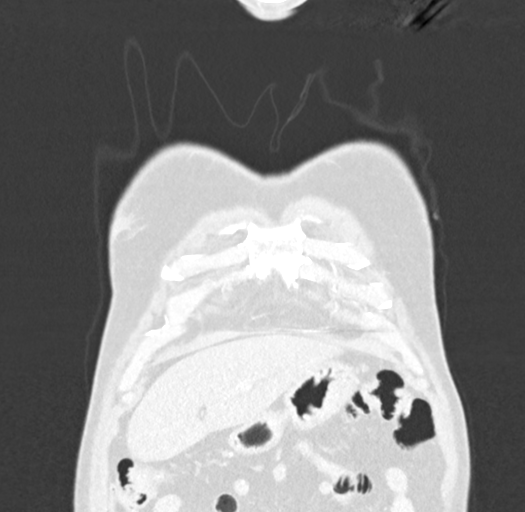
[im 70/174  lung]
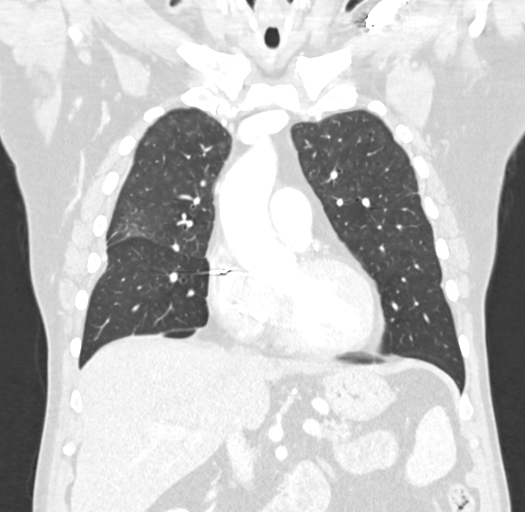
[im 104/174  lung]
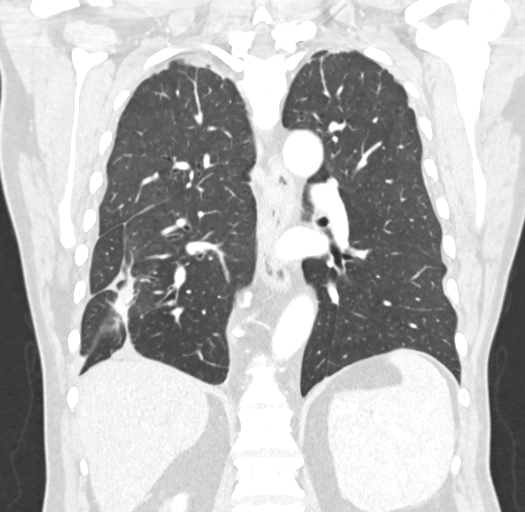

[14 of 36 positions shown; findings below may reference images not displayed]

FINDINGS: Cardiovascular: Cardiac size is borderline enlarged. Three-vessel
coronary artery calcifications are present. No pericardial effusion.
Left chest wall battery pack with pacer leads at the right atrium
and cardiac apex. Atherosclerotic plaque within the normal caliber
aorta. No acute luminal abnormality. No periaortic stranding or
hemorrhage. Three vessel branching of the aortic arch with minimal
plaque in the proximal great vessels. Central pulmonary arteries are
normal caliber. No large central or lobar filling defects on this
non tailored examination of the pulmonary arteries. No major venous
abnormality aside from traversal of the left brachiocephalic vein by
the pacer leads detailed above.

Mediastinum/Nodes: Persistent scattered subcentimeter mediastinal
and hilar lymph nodes are unchanged in size from prior. Small volume
fluid is present in the pericardial recesses, a normal finding. No
acute abnormality of the trachea or esophagus. Small right
posterolateral tracheal diverticulum is unchanged from comparison
([DATE]). Thyroid gland and thoracic inlet are unremarkable.

Lungs/Pleura: Small to moderate right pleural effusion with some
adjacent atelectatic changes. No left effusion. Bilateral calcified
pleural plaques are present. Additional bandlike area reticular
scarring and architectural distortion in the right lower lobe with
some adjacent cicatricial bronchiectasis. Appearance is unchanged
from comparison. Additional minimal scarring in the periphery of the
left upper lobe (3/73). Some additional thin bandlike opacities in
the lung bases elsewhere may reflect subsegmental atelectasis or
scarring. No new focal consolidative process, edema, or
pneumothorax. Centrilobular ground-glass nodularity is seen towards
the lung apices more readily apparent on this exam given a lack
respiratory motion artifact which was present on prior.

Upper Abdomen: No focal liver abnormality is seen. Patient is post
cholecystectomy. Slight prominence of the biliary tree likely
related to reservoir effect. No calcified intraductal gallstones.
Borderline splenomegaly. No acute abnormalities present in the
visualized portions of the upper abdomen. Mild lobular thickening of
the adrenal glands without concerning dominant adrenal nodule may
reflect nodular adrenal hyperplasia or benign adenomata.

Musculoskeletal: Multilevel degenerative changes are present in the
imaged portions of the spine. No acute osseous abnormality or
suspicious osseous lesion.
IMPRESSION: 1. Small to moderate right pleural effusion with some adjacent
atelectatic changes.
2. Calcified pleural plaques are present compatible with prior
asbestos related exposure.
3. Unchanged scarring and architectural distortion in the right
lower lobe and left upper lobe with some adjacent cicatricial
bronchiectasis, likely related to prior infection/inflammation.
4. Centrilobular ground-glass nodularity towards the lung apices
more readily apparent on this exam given a lack respiratory motion
artifact. Could reflect a few chronic inflammatory process including
are smoking related respiratory bronchiolitis ([place]).
5. Mild lobular thickening of the adrenal glands without concerning
dominant adrenal nodule may reflect nodular adrenal hyperplasia or
benign adenomata.
6. Cardiomegaly and coronary artery calcifications are present.
Please note that the presence of coronary artery calcium documents
the presence of coronary artery disease, the severity of this
disease and any potential stenosis cannot be assessed on this
non-gated CT examination. Assessment for potential risk factor
modification, dietary therapy or pharmacologic therapy may be
warranted.
7. Aortic Atherosclerosis ([id number]).

## 2024-02-13 ENCOUNTER — Other Ambulatory Visit: Payer: Self-pay

## 2024-02-13 ENCOUNTER — Encounter: Payer: Self-pay | Admitting: *Deleted

## 2024-02-13 ENCOUNTER — Emergency Department
Admission: EM | Admit: 2024-02-13 | Discharge: 2024-02-13 | Discharge status: Against Medical Advice | Attending: Emergency Medicine | Admitting: Emergency Medicine

## 2024-02-13 DIAGNOSIS — M79662 Pain in left lower leg: Secondary | ICD-10-CM | POA: Diagnosis not present

## 2024-02-13 DIAGNOSIS — M79661 Pain in right lower leg: Secondary | ICD-10-CM | POA: Insufficient documentation

## 2024-02-13 DIAGNOSIS — Z5321 Procedure and treatment not carried out due to patient leaving prior to being seen by health care provider: Secondary | ICD-10-CM | POA: Diagnosis not present

## 2024-02-13 LAB — BASIC METABOLIC PANEL WITH GFR
Anion gap: 15 (ref 5–15)
BUN: 6 mg/dL — ABNORMAL LOW (ref 8–23)
CO2: 28 mmol/L (ref 22–32)
Calcium: 9.6 mg/dL (ref 8.9–10.3)
Chloride: 97 mmol/L — ABNORMAL LOW (ref 98–111)
Creatinine, Ser: 0.57 mg/dL — ABNORMAL LOW (ref 0.61–1.24)
GFR, Estimated: >60 mL/min (ref >60)
Glucose, Bld: 89 mg/dL (ref 70–99)
Potassium: 3.3 mmol/L — ABNORMAL LOW (ref 3.5–5.1)
Sodium: 140 mmol/L (ref 135–145)

## 2024-02-13 LAB — CBC
HCT: 43.1 % (ref 39.0–52.0)
Hemoglobin: 15.6 g/dL (ref 13.0–17.0)
MCH: 37.5 pg — ABNORMAL HIGH (ref 26.0–34.0)
MCHC: 36.2 g/dL — ABNORMAL HIGH (ref 30.0–36.0)
MCV: 103.6 fL — ABNORMAL HIGH (ref 80.0–100.0)
Platelets: 107 10*3/uL — ABNORMAL LOW (ref 150–400)
RBC: 4.16 MIL/uL — ABNORMAL LOW (ref 4.22–5.81)
RDW: 13.7 % (ref 11.5–15.5)
WBC: 3.7 10*3/uL — ABNORMAL LOW (ref 4.0–10.5)
nRBC: 0 % (ref 0.0–0.2)

## 2024-02-13 NOTE — ED Triage Notes (Signed)
 First Nurse Note: Patient to ED via [facility] from home for bilateral leg pain. Hx neuropathy- out of pain meds. VS WNL  Per, EMS they were informed this pt has Bed Bugs- EMS did not personally see bugs.

## 2024-02-13 NOTE — ED Triage Notes (Addendum)
 Pt brought in via ems from home.  Pt ambulatory to triage.  Pt has pain in both lower legs.  No known injury.  Pt reports neuropathy in legs.  No swelling noted.  Pt denies chest pain or sob.  No n/v/d    Pt alert  speech clear.

## 2024-03-20 ENCOUNTER — Ambulatory Visit: Admission: EM | Admit: 2024-03-20 | Discharge: 2024-03-20 | Disposition: A | Discharge status: Home / Self Care

## 2024-03-20 DIAGNOSIS — G894 Chronic pain syndrome: Secondary | ICD-10-CM

## 2024-03-20 MED ORDER — KETOROLAC TROMETHAMINE 60 MG/2ML IM SOLN
30.0000 mg | Freq: Once | INTRAMUSCULAR | Status: AC
  Administered 2024-03-20: 30 mg via INTRAMUSCULAR

## 2024-03-20 NOTE — ED Triage Notes (Signed)
 Pt reports pain in both of his calf's x 4 months. Pt states the pain is worse when laying down. Taking tylenol  and Asprin with no relief of pain.   Pt states he see a vascular surgeon Tuesday.

## 2024-03-20 NOTE — Discharge Instructions (Signed)
-   Continue taking your prescription medications. - Follow-up with pain management clinic. - If you are not going to the previous pain management clinic please consult with your doctor about referral elsewhere. - Continue over-the-counter medications including Tylenol , Aleve, lidocaine  patches, Salonpas patches and consider use of heat and ice. - If pain is getting worse please go to the ER.

## 2024-03-20 NOTE — ED Provider Notes (Signed)
 [facility]    CSN: [id number] Arrival date & time: 03/20/24  1135      History   Chief Complaint Chief Complaint  Patient presents with   Leg Pain    HPI David Christensen is a 73 y.o. male with history of diabetes, hypertension, chronic pain, and claudication of both lower extremities.   Patient reports 4 month history of bilateral calf pain. He has been seeing [facility] [address] in [place]. Currently on buprenorphine patches and also taking Tylenol  and Aspirin. He is looking for something else for pain relief. He says he dropped 3 buprenorphine patches in the toilet and tried to get more from his pain clinic but they refused and he says they kicked him out of the clinic.  Patient has upcoming appointment with vascular surgery early next week and is hoping for some sort of solution to his problem at that time.  HPI  Past Medical History:  Diagnosis Date   Acid reflux    Cancer (HCC)    skin cancer   Depression    Diabetes mellitus without complication (HCC)    Hypertension    PTSD (post-traumatic stress disorder)     Patient Active Problem List   Diagnosis Date Noted   Cholecystitis 02/27/2015   Abdominal pain 02/26/2015    Past Surgical History:  Procedure Laterality Date   BACK SURGERY     CHOLECYSTECTOMY N/A 02/28/2015   Procedure: LAPAROSCOPIC CHOLECYSTECTOMY;  Surgeon: Scherry Curtis, MD;  Location: [facility] ORS;  Service: General;  Laterality: N/A;   MELANOMA EXCISION     PACEMAKER INSERTION Left    PROSTATECTOMY     TONSILLECTOMY AND ADENOIDECTOMY         Home Medications    Prior to Admission medications   Medication Sig Start Date End Date Taking? Authorizing Provider  amLODipine (NORVASC) 10 MG tablet Take 10 mg by mouth daily. 02/10/15 03/20/24 Yes [provider]  aspirin EC 81 MG tablet Take 81 mg by mouth daily. 04/25/12  Yes [provider]  atorvastatin (LIPITOR) 10 MG tablet Take 10 mg  by mouth daily.   Yes [provider]  gabapentin (NEURONTIN) 100 MG capsule Take 100 mg by mouth. 02/21/24 02/20/25 Yes [provider]  hydrochlorothiazide (HYDRODIURIL) 12.5 MG tablet Take 1 tablet by mouth daily. 04/08/20  Yes [provider]  losartan (COZAAR) 50 MG tablet Take 50 mg by mouth daily.   Yes [provider]  omeprazole (PRILOSEC) 20 MG capsule Take 20 mg by mouth daily. 11/19/12  Yes [provider]  potassium chloride  SA (K-DUR,KLOR-CON ) 20 MEQ tablet Take 1 tablet by mouth daily. 02/13/15  Yes [provider]  pregabalin  (LYRICA ) 100 MG capsule Take 1 capsule by mouth. 01/19/15  Yes [provider]  QUEtiapine (SEROQUEL) 100 MG tablet TAKE 2&1/2 TABLETS BY MOUTH AT BEDTIME 03/23/23  Yes [provider]  senna (SENOKOT) 8.6 MG TABS tablet Take 1 tablet by mouth daily. 02/24/15  Yes [provider]  sertraline  (ZOLOFT ) 100 MG tablet Take 100 mg by mouth daily. 04/25/12  Yes [provider]  albuterol  (PROVENTIL  HFA;VENTOLIN  HFA) 108 (90 Base) MCG/ACT inhaler Inhale 2 puffs into the lungs every 4 (four) hours as needed for wheezing or shortness of breath. 12/12/17   Triplett, Cari B, FNP  cetirizine (ZYRTEC) 10 MG tablet Take 10 mg by mouth daily.    [provider]  chlorpheniramine-HYDROcodone (TUSSIONEX PENNKINETIC ER) 10-8 MG/5ML SUER Take 5 mLs  by mouth 2 (two) times daily. 02/13/20   Shayne Demark, MD  diazepam (VALIUM) 5 MG tablet Take 5 mg by mouth 2 (two) times daily.    [provider]  fluticasone (FLONASE) 50 MCG/ACT nasal spray Place 2 sprays into the nose at bedtime. 11/06/14   [provider]  morphine  (MS CONTIN ) 15 MG 12 hr tablet Take 15 mg by mouth every 12 (twelve) hours.    [provider]  oxyCODONE -acetaminophen  (PERCOCET) 10-325 MG per tablet Take 1 tablet by mouth every 6 (six) hours as needed.    [provider]   oxyCODONE -acetaminophen  (PERCOCET/ROXICET) 5-325 MG per tablet Take 1-2 tablets by mouth every 4 (four) hours as needed for moderate pain. 03/01/15   Scherry Curtis, MD  predniSONE  (STERAPRED UNI-PAK 21 TAB) 10 MG (21) TBPK tablet Take by mouth daily. Dispense 12 day prednisone  taper as directed 02/13/20   Shayne Demark, MD  tamsulosin  (FLOMAX ) 0.4 MG CAPS capsule Take 1 capsule by mouth daily.    [provider]  traZODone (DESYREL) 100 MG tablet Take 1 tablet by mouth at bedtime. 12/14/14   [provider]  zolpidem (AMBIEN) 10 MG tablet Take 10 mg by mouth at bedtime as needed.   Yes [provider]    Family History Family History  Problem Relation Age of Onset   Lung disease Mother    Stroke Father     Social History Social History   Tobacco Use   Smoking status: Every Day    Types: Cigarettes   Smokeless tobacco: Never  Vaping Use   Vaping status: Never Used  Substance Use Topics   Alcohol use: Yes    Alcohol/week: 2.0 standard drinks of alcohol    Types: 2 Shots of liquor per week   Drug use: No     Allergies   Alpha-gal, Metformin and related, Penicillins, and Zolpidem   Review of Systems Review of Systems  Musculoskeletal:  Positive for arthralgias. Negative for gait problem and joint swelling.  Skin:  Negative for color change and wound.  Neurological:  Negative for weakness and numbness.     Physical Exam Triage Vital Signs ED Triage Vitals  Encounter Vitals Group     BP 03/20/24 1203 (!) 149/73     Systolic BP Percentile --      Diastolic BP Percentile --      Pulse Rate 03/20/24 1203 78     Resp 03/20/24 1203 18     Temp 03/20/24 1203 98.3 F (36.8 C)     Temp Source 03/20/24 1203 Oral     SpO2 03/20/24 1203 95 %     Weight --      Height --      Head Circumference --      Peak Flow --      Pain Score 03/20/24 1204 9     Pain Loc --      Pain Education --      Exclude from Growth Chart --    No data  found.  Updated Vital Signs BP (!) 149/73 (BP Location: Left Arm)   Pulse 78   Temp 98.3 F (36.8 C) (Oral)   Resp 18   SpO2 95%     Physical Exam Vitals and nursing note reviewed.  Constitutional:      General: He is not in acute distress.    Appearance: Normal appearance. He is well-developed.  HENT:     Head: Normocephalic and atraumatic.  Eyes:     General: No scleral icterus.    Conjunctiva/sclera: Conjunctivae normal.  Cardiovascular:     Rate and Rhythm: Normal rate and regular rhythm.  Pulmonary:     Effort: Pulmonary effort is normal. No respiratory distress.     Breath sounds: Normal breath sounds.  Musculoskeletal:        General: Tenderness (bilateral calves tender. No swelling) present.     Cervical back: Neck supple.  Skin:    General: Skin is warm and dry.     Capillary Refill: Capillary refill takes less than 2 seconds.  Neurological:     General: No focal deficit present.     Mental Status: He is alert. Mental status is at baseline.     Motor: No weakness.     Gait: Gait normal.  Psychiatric:        Mood and Affect: Mood normal.        Behavior: Behavior normal.      UC Treatments / Results  Labs (all labs ordered are listed, but only abnormal results are displayed) Labs Reviewed - No data to display  EKG   Radiology No results found.  Procedures Procedures (including critical care time)  Medications Ordered in UC Medications  ketorolac  (TORADOL ) injection 30 mg (30 mg Intramuscular Given 03/20/24 1226)    Initial Impression / Assessment and Plan / UC Course  I have reviewed the triage vital signs and the nursing notes.  Pertinent labs & imaging results that were available during my care of the patient were reviewed by me and considered in my medical decision making (see chart for details).  73 y/o male with history of chronic pain presents for pain of both calves for months. He used to be on narcotics and has been switched to  buprenorphine but he says that has not given him relief.  Contacted pain management clinic for something else for pain.  They declined to send anything else.  Patient told me he accidentally flushed 3 of his patches down the toilet.  It seems he went through at least 3 buprenorphine patches in less than a week.  See note below.   Per chart review from pain management provider: I am sorry he is having so much pain. There is no other medication for us  to prescribe him. I see he has an appointment with me on 6/17. He has over-taken everything we have prescribed him thus far. I do not feel comfortable prescribing even gabapentin at this point. He can ask his PCP for a referral to another pain clinic for another opinion if he'd like. Electronically signed by Niki Barter, MD at 03/18/2024 3:43 PM EDT   At this time, advised patient we could only offer him ketorolac  injection and he accepts.  Advised him to follow back up with his pain management clinic or follow-up on a referral from PCP to another pain management clinic.  Continue home meds.  If pain worsens advised to go to the emergency department. He should keep follow up with vascular surgery next month.   Final Clinical Impressions(s) / UC Diagnoses   Final diagnoses:  Chronic pain syndrome     Discharge Instructions      - Continue taking your prescription medications. - Follow-up with pain management clinic. - If you are not going to the previous pain management clinic please consult with your doctor about referral elsewhere. - Continue over-the-counter medications including Tylenol , Aleve, lidocaine  patches, Salonpas patches and consider use of heat  and ice. - If pain is getting worse please go to the ER.  ED Prescriptions   None    I have reviewed the PDMP during this encounter.   Floydene Hy, PA-C 03/20/24 1308

## 2024-04-18 ENCOUNTER — Emergency Department: Admission: EM | Admit: 2024-04-18 | Discharge: 2024-04-18 | Disposition: A | Discharge status: Home / Self Care

## 2024-04-18 ENCOUNTER — Encounter: Payer: Self-pay | Admitting: Emergency Medicine

## 2024-04-18 ENCOUNTER — Other Ambulatory Visit: Payer: Self-pay

## 2024-04-18 DIAGNOSIS — Z859 Personal history of malignant neoplasm, unspecified: Secondary | ICD-10-CM | POA: Diagnosis not present

## 2024-04-18 DIAGNOSIS — E119 Type 2 diabetes mellitus without complications: Secondary | ICD-10-CM | POA: Insufficient documentation

## 2024-04-18 DIAGNOSIS — Z79899 Other long term (current) drug therapy: Secondary | ICD-10-CM | POA: Diagnosis not present

## 2024-04-18 DIAGNOSIS — G8929 Other chronic pain: Secondary | ICD-10-CM | POA: Insufficient documentation

## 2024-04-18 DIAGNOSIS — M545 Low back pain, unspecified: Secondary | ICD-10-CM | POA: Diagnosis present

## 2024-04-18 DIAGNOSIS — I10 Essential (primary) hypertension: Secondary | ICD-10-CM | POA: Insufficient documentation

## 2024-04-18 DIAGNOSIS — Z950 Presence of cardiac pacemaker: Secondary | ICD-10-CM | POA: Diagnosis not present

## 2024-04-18 LAB — CBC WITH DIFFERENTIAL/PLATELET
Abs Immature Granulocytes: 0.02 K/uL (ref 0.00–0.07)
Basophils Absolute: 0 K/uL (ref 0.0–0.1)
Basophils Relative: 1 %
Eosinophils Absolute: 0 K/uL (ref 0.0–0.5)
Eosinophils Relative: 1 %
HCT: 39 % (ref 39.0–52.0)
Hemoglobin: 14.3 g/dL (ref 13.0–17.0)
Immature Granulocytes: 1 %
Lymphocytes Relative: 19 %
Lymphs Abs: 0.7 K/uL (ref 0.7–4.0)
MCH: 37.4 pg — ABNORMAL HIGH (ref 26.0–34.0)
MCHC: 36.7 g/dL — ABNORMAL HIGH (ref 30.0–36.0)
MCV: 102.1 fL — ABNORMAL HIGH (ref 80.0–100.0)
Monocytes Absolute: 0.2 K/uL (ref 0.1–1.0)
Monocytes Relative: 7 %
Neutro Abs: 2.6 K/uL (ref 1.7–7.7)
Neutrophils Relative %: 71 %
Platelets: 91 K/uL — ABNORMAL LOW (ref 150–400)
RBC: 3.82 MIL/uL — ABNORMAL LOW (ref 4.22–5.81)
RDW: 14.4 % (ref 11.5–15.5)
Smear Review: NORMAL
WBC: 3.6 K/uL — ABNORMAL LOW (ref 4.0–10.5)
nRBC: 0 % (ref 0.0–0.2)

## 2024-04-18 LAB — URINALYSIS, ROUTINE W REFLEX MICROSCOPIC
Bilirubin Urine: NEGATIVE
Glucose, UA: NEGATIVE mg/dL
Hgb urine dipstick: NEGATIVE
Ketones, ur: 5 mg/dL — AB
Leukocytes,Ua: NEGATIVE
Nitrite: NEGATIVE
Protein, ur: NEGATIVE mg/dL
Specific Gravity, Urine: 1.018 (ref 1.005–1.030)
pH: 5 (ref 5.0–8.0)

## 2024-04-18 LAB — COMPREHENSIVE METABOLIC PANEL WITH GFR
ALT: 14 U/L (ref 0–44)
AST: 23 U/L (ref 15–41)
Albumin: 4 g/dL (ref 3.5–5.0)
Alkaline Phosphatase: 64 U/L (ref 38–126)
Anion gap: 15 (ref 5–15)
BUN: 12 mg/dL (ref 8–23)
CO2: 24 mmol/L (ref 22–32)
Calcium: 9.4 mg/dL (ref 8.9–10.3)
Chloride: 92 mmol/L — ABNORMAL LOW (ref 98–111)
Creatinine, Ser: 0.7 mg/dL (ref 0.61–1.24)
GFR, Estimated: >60 mL/min (ref >60)
Glucose, Bld: 110 mg/dL — ABNORMAL HIGH (ref 70–99)
Potassium: 3.1 mmol/L — ABNORMAL LOW (ref 3.5–5.1)
Sodium: 131 mmol/L — ABNORMAL LOW (ref 135–145)
Total Bilirubin: 1.3 mg/dL — ABNORMAL HIGH (ref 0.0–1.2)
Total Protein: 6.6 g/dL (ref 6.5–8.1)

## 2024-04-18 LAB — URINE DRUG SCREEN, QUALITATIVE (ARMC ONLY)
Amphetamines, Ur Screen: NOT DETECTED
Barbiturates, Ur Screen: NOT DETECTED
Benzodiazepine, Ur Scrn: NOT DETECTED
Cannabinoid 50 Ng, Ur ~~LOC~~: NOT DETECTED
Cocaine Metabolite,Ur ~~LOC~~: NOT DETECTED
MDMA (Ecstasy)Ur Screen: NOT DETECTED
Methadone Scn, Ur: NOT DETECTED
Opiate, Ur Screen: NOT DETECTED
Phencyclidine (PCP) Ur S: NOT DETECTED
Tricyclic, Ur Screen: NOT DETECTED

## 2024-04-18 LAB — SALICYLATE LEVEL: Salicylate Lvl: <7 mg/dL — ABNORMAL LOW (ref 7.0–30.0)

## 2024-04-18 LAB — ACETAMINOPHEN LEVEL: Acetaminophen (Tylenol), Serum: 22 ug/mL (ref 10–30)

## 2024-04-18 LAB — LIPASE, BLOOD: Lipase: 30 U/L (ref 11–51)

## 2024-04-18 MED ORDER — OXYCODONE HCL 5 MG PO TABS
5.0000 mg | ORAL_TABLET | Freq: Once | ORAL | Status: AC
  Administered 2024-04-18: 5 mg via ORAL
  Filled 2024-04-18: qty 1

## 2024-04-18 MED ORDER — LIDOCAINE 5 % EX PTCH
1.0000 | MEDICATED_PATCH | CUTANEOUS | Status: DC
  Administered 2024-04-18: 1 via TRANSDERMAL
  Filled 2024-04-18: qty 1

## 2024-04-18 NOTE — ED Provider Notes (Signed)
 [facility] Provider Note    Event Date/Time   First MD Initiated Contact with Patient 04/18/24 1220     (approximate)   History   Back Pain   HPI  David Christensen is a 73 y.o. male   presents to the ED with complaint of chronic low back pain.  No new injury or symptoms.  Patient states that he has been taking more over-the-counter medications than usual to help with his pain.  He also reports that he has been taking approximately 4 Tylenol  5-6 times per day without any relief of his back pain.  He states he has not had any pain medication and weeks.  Patient has history of chronic back pain was being seen at a pain clinic at [facility] but was dismissed due to taking his pain medication inappropriately.  He states that his PCP is in the process now of referring him to another pain clinic.  Currently denies any incontinence of bowel or bladder or saddle anesthesias.  Patient has a history of back surgery, cancer, diabetes, hypertension, PTSD, pacemaker, melanoma excision.      Physical Exam   Triage Vital Signs: ED Triage Vitals [04/18/24 1152]  Encounter Vitals Group     BP 136/70     Girls Systolic BP Percentile      Girls Diastolic BP Percentile      Boys Systolic BP Percentile      Boys Diastolic BP Percentile      Pulse Rate 79     Resp 18     Temp (!) 97.5 F (36.4 C)     Temp Source Oral     SpO2 94 %     Weight 172 lb (78 kg)     Height 5' 7 (1.702 m)     Head Circumference      Peak Flow      Pain Score 10     Pain Loc      Pain Education      Exclude from Growth Chart     Most recent vital signs: Vitals:   04/18/24 1152  BP: 136/70  Pulse: 79  Resp: 18  Temp: (!) 97.5 F (36.4 C)  SpO2: 94%     General: Awake, no distress.  Patient is alert and oriented pacing about the examination room without any assistance. CV:  Good peripheral perfusion.  Heart regular rate rhythm. Resp:  Normal effort.  Lungs clear bilaterally. Abd:  No  distention.  Nontender. Other:  There is no point tenderness on palpation of the thoracic or lumbar spinous processes.  There is however some tenderness on palpation of the left lateral paravertebral muscles.  Range of motion is slow and guarded secondary to reproduction of this pain.  Patient is able to stand and ambulate without any assistance.  Good muscle strength bilaterally.  Skin is intact and no discoloration or abrasions are seen.   ED Results / Procedures / Treatments   Labs (all labs ordered are listed, but only abnormal results are displayed) Labs Reviewed  CBC WITH DIFFERENTIAL/PLATELET - Abnormal; Notable for the following components:      Result Value   WBC 3.6 (*)    RBC 3.82 (*)    MCV 102.1 (*)    MCH 37.4 (*)    MCHC 36.7 (*)    Platelets 91 (*)    All other components within normal limits  COMPREHENSIVE METABOLIC PANEL WITH GFR - Abnormal; Notable for the following components:  Sodium 131 (*)    Potassium 3.1 (*)    Chloride 92 (*)    Glucose, Bld 110 (*)    Total Bilirubin 1.3 (*)    All other components within normal limits  SALICYLATE LEVEL - Abnormal; Notable for the following components:   Salicylate Lvl <7.0 (*)    All other components within normal limits  URINALYSIS, ROUTINE W REFLEX MICROSCOPIC - Abnormal; Notable for the following components:   Color, Urine YELLOW (*)    APPearance CLEAR (*)    Ketones, ur 5 (*)    All other components within normal limits  ACETAMINOPHEN  LEVEL  LIPASE, BLOOD  URINE DRUG SCREEN, QUALITATIVE ([facility] ONLY)      PROCEDURES:  Critical Care performed:   Procedures   MEDICATIONS ORDERED IN ED: Medications  lidocaine  (LIDODERM ) 5 % 1 patch (has no administration in time range)  oxyCODONE  (Oxy IR/ROXICODONE ) immediate release tablet 5 mg (has no administration in time range)     IMPRESSION / MDM / ASSESSMENT AND PLAN / ED COURSE  I reviewed the triage vital signs and the nursing notes.   Differential  diagnosis includes, but is not limited to, acute on chronic low back pain, chronic back pain, inappropriate use of narcotics, cauda equina was considered but not demonstrated, urinary tract infection, urolithiasis, acetaminophen  overdose..  73 year old male presents to the ED with complaint of left lower back pain with history of chronic back pain.  Patient states that he has been out of pain medication for many weeks and currently his PCP is trying to get him into another pain clinic.  He was divorced from the pain clinic that he was seeing at [facility] due to inappropriate narcotic use.  He was sent to the emergency department for possible overdose of his stated acetaminophen  from last evening.  Lab work was reassuring and patient was made aware that his lab work and urinalysis was benign.  In light of his inappropriate narcotic use patient was given a one-time dose of oxycodone  while in the ED and a lidocaine  patch to his left lower back.  Patient was strongly encouraged to call his PCP tomorrow for further evaluation and instructions.  We did discuss his use of Tylenol  and the likelihood that he could cause bodily damage with taking too many Tylenol  and to frequently which he acknowledges and understands.      Patient's presentation is most consistent with exacerbation of chronic illness.  FINAL CLINICAL IMPRESSION(S) / ED DIAGNOSES   Final diagnoses:  Chronic left-sided low back pain without sciatica     Rx / DC Orders   ED Discharge Orders     None        Note:  This document was prepared using Dragon voice recognition software and may include unintentional dictation errors.   Saunders Shona CROME, PA-C 04/18/24 1528    Clarine Ozell LABOR, MD 04/21/24 [phone]

## 2024-04-18 NOTE — Discharge Instructions (Signed)
 Call your primary care provider to make arrangements for a pain clinic.  A lidocaine  patch was applied to your back and 1 tablet of your pain medication was given while in the emergency department.  You may use ice or heat to your back as needed for discomfort.

## 2024-04-18 NOTE — ED Notes (Signed)
 Patient c/o continued pain in headache, back and legs. Provider aware.

## 2024-04-18 NOTE — ED Triage Notes (Signed)
 Pt to ER via EMS for home.  States he has been taking Tylenol , Advil and meloxicam  at home for pain.  States he thinks he has taken too much because he is having abdominal pain.  Denies n/v.  States continues to have back pain and leg pain.  States is not sleeping. Pt states he was taking percocet and the doctors took him off of same.

## 2024-04-18 NOTE — ED Notes (Signed)
 Patient states he has taken 4-5 500mg  tablets of Tylenol  approximately 4 times a day after being taken off Percocet to try to control pain in lower legs. Patient c/o kidney pain in the back and bilateral lower extremity pain.

## 2024-04-18 NOTE — ED Notes (Signed)
 Patient called out for pain medication to take the edge off. Provider aware.

## 2024-04-19 NOTE — Progress Notes (Signed)
 ------------------------------------------------------------------------------- Attestation signed by Rodrick Norris, MD at 04/29/24 1233 I was present on site and I have personally seen, examined, and evaluated the patient, reviewed all relevant imaging findings and lab results and concur with the note as documented by Dr. Cyrena. I additionally reviewed and discussed the case with the resident/fellow/APP and participated in the key portions of the service. I agree with the findings and the plan of care as documented in the resident/APP note and plan, with the following additions:  Patient with relief of claudication type symptoms with leaning forward and upright posture, history of back problems. Seems more like neurogenic claudication especially given his reassuring duplex/ABI, palpable distal pulses. I have referred him to Dr. Galgano and will see him back in 6 mo with repeat duplex   Norris Rodrick, MD Assistant Professor [facility] Division of Vascular Surgery  -------------------------------------------------------------------------------    VASCULAR SURGERY [facility]  Patient Name: David Christensen [facility] Encounter Date: 04/29/2024 Referring provider: Dolleschel Surgeon: Dr. Rodrick  ASSESSMENT   73 y.o. male with history of alpha-gal allergy, diabetes, hypertension, lumbar spondylosis, and chronic low back pain, who presents with BLE calf pain at rest. Due to patient's history of chronic back pain due to lumbar spondylosis, his bilateral calf claudication could be of neurogenic etiology.   PLAN   Follow up in 6 months with duplex ultrasound. Return precautions reviewed.   Referral to neurosurgery for neurogenic claudication. Referral to endocrinology made today and letter signed: No. Medical optimization Statin:atorvastatin  10mg  Smoking cessation: patient referred to smoking cessation program  Antiplatelet therapy: aspirin BP and diabetic management per PCP. Goal A1c <7%.  Last A1c was 5.8 on 03/15/2024.  HISTORY OF PRESENT ILLNESS    David Christensen is a 73 y.o. male with history of diabetes, hypertension, and chronic pain, who presents with BLE PAD and rest pain bilateral lower extremities.   Patient describes pain as throbbing, most notable to calves bilaterally and occurs at rest without exertion. This pain started earlier this year with no inciting event. The calf pain is persistent and localized and is worse at night. Pain is slightly improved upon leaning forward.   Patient denies wounds or sores that do not heal on feet and changes in skin color or temperature. Current smoker (1 ppd).   ROS: The balance of 10 systems reviewed is negative except as noted in the HPI   RX/ ALLERGIES/ MED HX/SURG HX/ SOC HX/FAM HX: Reviewed & updated in Epic  Current Outpatient Medications  Medication Instructions  . acetaminophen  (TYLENOL ) 500 mg  . albuterol  HFA 90 mcg/actuation inhaler 2 puffs, Inhalation, Every 6 hours PRN  . amlodipine  (NORVASC ) 10 MG tablet TAKE 1 TABLET BY MOUTH ONCE EVERY MORNING  . atorvastatin  (LIPITOR) 10 mg, Oral, Nightly  . BAYER LOW DOSE ASPIRIN 81 mg, Daily (standard)  . diclofenac sodium (VOLTAREN) 4 g, Topical, 4 times a day PRN  . EPINEPHrine  (EPIPEN ) 0.3 mg/0.3 mL injection INJECT 1 SYRINGE INTO OUTER THIGH ONCE AS NEEDED FOR SEVERE ALLERGIC REACTION.  . fluticasone  propionate (FLONASE ) 50 mcg/actuation nasal spray PLACE 1 SPRAY INTO EACH NOSTRIL ONCE DAILY  . gabapentin  (NEURONTIN ) 100 mg, Oral, 3 times a day (standard)  . hydroCHLOROthiazide  12.5 mg, Oral, Daily (standard)  . lidocaine  (XYLOCAINE ) 5 % ointment Topical, 3 times a day (standard)  . losartan  (COZAAR ) 50 mg, Oral, Daily (standard)  . meloxicam  (MOBIC ) 7.5 mg, Oral, Daily (standard)  . metoPROLOL succinate (TOPROL-XL) 25 mg, Oral, Daily (standard)  . naloxone (NARCAN) 4 mg  nasal spray One spray in either nostril once for known/suspected opioid overdose. May repeat  every 2-3 minutes in alternating nostril til EMS arrives  . pantoprazole  (PROTONIX ) 40 mg, Oral, 2 times a day (standard)  . prazosin (MINIPRESS) 4 mg, Oral, 2 times a day (standard)  . QUEtiapine  (SEROQUEL ) 100 MG tablet TAKE 2&1/2 TABLETS BY MOUTH AT BEDTIME  . zolpidem  (AMBIEN  CR) 12.5 mg, Oral, Nightly PRN  . zolpidem  (AMBIEN  CR) 12.5 mg, Oral, Nightly PRN     PHYSICAL EXAM    There were no vitals filed for this visit. General: well appearing, no acute distress  Head: normocephalic, atraumatic Neck: Supple  Cardiac: RRR  Lungs: Easy work of breathing, CTA Bilaterally Abdomen: Soft, non-distended, non tender, without mass MSK: negative joint tenderness, normal gait and ROM Skin: intact, good turgor Neuro: AAO x 3, appropriate to questions Extremities: Upper extremities warm and perfused. Bilateral lower extremities negative for edema, ulceration or digital gangrene.  Pulses: Bilateral radial pulses palpable. Palpable DP/PT pulses       DIAGNOSTICS    Images and reports reviewed independently.  FINDINGS:    Left Brachial Pressure  106/57 (mmHg)                                Right Brachial Pressure 98/55 (mmHg)                                                                                         Peak Systolic Velocity (cm/s))  Left CFA prox 186 cm/s triphasic  Left Pop A prox 80 cm/s triphasic  Left ATA distal  125 , triphasic  Left PTA distal  78 triphasic  Left CFA acceleration time 104 (ms)    Left ATA Pressure 122 (mmHg)  Left PTA Pressure 134 (mmHg)  Left ABI: 1.3      Right CFA prox 143 cm/s triphasic  Right POP A prox 91 cm/s triphasic  Right ATA distal  146, triphasic  Right PTA distal  115, triphasic  Right CFA acceleration time 92 (ms)    Right ATA Pressure 138 mmHg  Right PTA Pressure 124 mmHg  Right ABI: 1.3     Labs: Lab Results  Component Value Date   CREATININE 0.70 11/09/2023   LDL 39 (L) 11/09/2023   A1C 5.8 03/15/2024

## 2024-05-14 ENCOUNTER — Other Ambulatory Visit: Payer: Self-pay

## 2024-05-14 ENCOUNTER — Encounter: Payer: Self-pay | Admitting: Emergency Medicine

## 2024-05-14 ENCOUNTER — Emergency Department
Admission: EM | Admit: 2024-05-14 | Discharge: 2024-05-14 | Disposition: A | Discharge status: Home / Self Care | Source: Home / Self Care | Attending: Emergency Medicine | Admitting: Emergency Medicine

## 2024-05-14 ENCOUNTER — Emergency Department

## 2024-05-14 DIAGNOSIS — F10129 Alcohol abuse with intoxication, unspecified: Secondary | ICD-10-CM | POA: Insufficient documentation

## 2024-05-14 DIAGNOSIS — A419 Sepsis, unspecified organism: Secondary | ICD-10-CM | POA: Diagnosis not present

## 2024-05-14 DIAGNOSIS — I10 Essential (primary) hypertension: Secondary | ICD-10-CM | POA: Insufficient documentation

## 2024-05-14 DIAGNOSIS — E119 Type 2 diabetes mellitus without complications: Secondary | ICD-10-CM | POA: Insufficient documentation

## 2024-05-14 DIAGNOSIS — R519 Headache, unspecified: Secondary | ICD-10-CM | POA: Insufficient documentation

## 2024-05-14 DIAGNOSIS — Y909 Presence of alcohol in blood, level not specified: Secondary | ICD-10-CM | POA: Insufficient documentation

## 2024-05-14 DIAGNOSIS — F10920 Alcohol use, unspecified with intoxication, uncomplicated: Secondary | ICD-10-CM

## 2024-05-14 NOTE — ED Notes (Signed)
 Bed alarm on, pt close to nursing station in hallway stretcher, arm band in place and non-slip socks on. Education provided on call, don't fall.

## 2024-05-14 NOTE — ED Triage Notes (Signed)
 Pt arrived via [facility] from home with c/o headache. Upon arrival to ED, pt deliberately went down to the floor when standing and stretched out on floor, abdomen down, arms stretched out attempting to make call on his cell phone and stated, I aint staying here. I aint getting in that chair. I aint doing it. You just leave me right here. Staff attempting to assist pt up and informing him that he can not lay on the floor and pt sts, I aint getting in that chair. Ya'll just leave me the hell alone. Im leaving here. Staff finally able to get pt to upright position and onto a stretcher. Pt on phone with [facility] who he request to come pick him up.    ** Pt denies recent falls but has multiple staging of bruising to head/face/bilateral upper and lower extremities  **Pt reports he has had pain in his head x2 weeks, worsening over last 24-48 hours. Pt admits to drinking 3-4 alcoholic drinks tonight.

## 2024-05-14 NOTE — ED Provider Notes (Signed)
 [facility] Provider Note    Event Date/Time   First MD Initiated Contact with Patient 05/14/24 0115     (approximate)   History   Headache and Alcohol Intoxication   HPI  David Christensen is a 73 y.o. male with a history of alpha gal allergy, hypertension, diabetes, lumbar spondylosis, chronic low back pain who presents with headache.  The patient called EMS complaining of a headache and also endorsed alcohol use.  However, immediately upon arrival to the ED, he laid down on the floor and stated that he wanted to leave.  He states now that he did this because he was scared.  However, the patient states that he does not want anything for the headache and wants to go home.  He endorses alcohol use tonight.  He denies any trauma.  The headache has been present for 2 weeks.  I reviewed the past medical records.  The patient's most recent outpatient encounter was at vascular surgery at [facility] on 7/21 for calf claudication.  This was thought to be neurologic in etiology.   Physical Exam   Triage Vital Signs: ED Triage Vitals  Encounter Vitals Group     BP 05/14/24 0046 128/75     Girls Systolic BP Percentile --      Girls Diastolic BP Percentile --      Boys Systolic BP Percentile --      Boys Diastolic BP Percentile --      Pulse Rate 05/14/24 0046 63     Resp 05/14/24 0046 17     Temp 05/14/24 0046 97.8 F (36.6 C)     Temp Source 05/14/24 0046 Oral     SpO2 05/14/24 0046 98 %     Weight 05/14/24 0047 171 lb 11.8 oz (77.9 kg)     Height 05/14/24 0047 5' 7 (1.702 m)     Head Circumference --      Peak Flow --      Pain Score 05/14/24 0056 10     Pain Loc --      Pain Education --      Exclude from Growth Chart --     Most recent vital signs: Vitals:   05/14/24 0046 05/14/24 0049  BP: 128/75   Pulse: 63   Resp: 17   Temp: 97.8 F (36.6 C)   SpO2: 98% 98%    General: Alert, no distress.  CV:  Good peripheral perfusion.  Resp:  Normal effort.   Abd:  No distention.  Other:  EOMI.  PERRLA.  No photophobia.  No facial droop.  Normal speech.  Motor intact in all extremities.  Normal gait.  Possible mild swelling and scattered bruising to the scalp and bilateral extremities.   ED Results / Procedures / Treatments   Labs (all labs ordered are listed, but only abnormal results are displayed) Labs Reviewed - No data to display   EKG    RADIOLOGY  CT head: I independently viewed and interpreted the images; there is no ICH.  Radiology report indicates the following:  IMPRESSION:  1. No acute intracranial abnormality.  2. Mild chronic ischemic white matter changes.   CT cervical spine: No acute fracture  PROCEDURES:  Critical Care performed: No  Procedures   MEDICATIONS ORDERED IN ED: Medications - No data to display   IMPRESSION / MDM / ASSESSMENT AND PLAN / ED COURSE  I reviewed the triage vital signs and the nursing notes.  73 year old male with PMH as  noted above presents with headache and alcohol intoxication.  The patient states that he wants to go home.  He declines wanting any help.  The charge RN ordered CT head and cervical spine due to concern for possible bruising on the patient's head.  These are negative.  Differential diagnosis includes, but is not limited to, minor head injury, concussion.  There is no evidence of traumatic brain injury or cervical spine fracture.  Patient's presentation is most consistent with acute complicated illness / injury requiring diagnostic workup.  At this time the patient is requesting discharge.  He is alert and appears mildly intoxicated, but is ambulating without difficulty.  There is no indication for further ED workup.  He is stable for discharge home at this time.  Return precautions have been provided.   FINAL CLINICAL IMPRESSION(S) / ED DIAGNOSES   Final diagnoses:  Alcoholic intoxication without complication (HCC)  Acute nonintractable headache, unspecified  headache type     Rx / DC Orders   ED Discharge Orders     None        Note:  This document was prepared using Dragon voice recognition software and may include unintentional dictation errors.    Jacolyn Pae, MD 05/14/24 [phone]

## 2024-05-17 ENCOUNTER — Emergency Department

## 2024-05-17 ENCOUNTER — Inpatient Hospital Stay
Admission: EM | Admit: 2024-05-17 | Discharge: 2024-05-22 | DRG: 871 | Disposition: A | Discharge status: Home Health | Attending: Internal Medicine | Admitting: Internal Medicine

## 2024-05-17 DIAGNOSIS — F1120 Opioid dependence, uncomplicated: Secondary | ICD-10-CM | POA: Diagnosis present

## 2024-05-17 DIAGNOSIS — Z1152 Encounter for screening for COVID-19: Secondary | ICD-10-CM | POA: Diagnosis not present

## 2024-05-17 DIAGNOSIS — Z85828 Personal history of other malignant neoplasm of skin: Secondary | ICD-10-CM

## 2024-05-17 DIAGNOSIS — D638 Anemia in other chronic diseases classified elsewhere: Secondary | ICD-10-CM | POA: Diagnosis present

## 2024-05-17 DIAGNOSIS — F32A Depression, unspecified: Secondary | ICD-10-CM | POA: Diagnosis present

## 2024-05-17 DIAGNOSIS — E871 Hypo-osmolality and hyponatremia: Secondary | ICD-10-CM | POA: Diagnosis present

## 2024-05-17 DIAGNOSIS — E8722 Chronic metabolic acidosis: Secondary | ICD-10-CM | POA: Diagnosis present

## 2024-05-17 DIAGNOSIS — R54 Age-related physical debility: Secondary | ICD-10-CM | POA: Diagnosis present

## 2024-05-17 DIAGNOSIS — D61818 Other pancytopenia: Secondary | ICD-10-CM | POA: Diagnosis not present

## 2024-05-17 DIAGNOSIS — E860 Dehydration: Secondary | ICD-10-CM | POA: Diagnosis present

## 2024-05-17 DIAGNOSIS — K922 Gastrointestinal hemorrhage, unspecified: Secondary | ICD-10-CM | POA: Diagnosis not present

## 2024-05-17 DIAGNOSIS — N179 Acute kidney failure, unspecified: Secondary | ICD-10-CM | POA: Diagnosis present

## 2024-05-17 DIAGNOSIS — R7989 Other specified abnormal findings of blood chemistry: Secondary | ICD-10-CM | POA: Diagnosis present

## 2024-05-17 DIAGNOSIS — J189 Pneumonia, unspecified organism: Secondary | ICD-10-CM

## 2024-05-17 DIAGNOSIS — Z823 Family history of stroke: Secondary | ICD-10-CM

## 2024-05-17 DIAGNOSIS — Z888 Allergy status to other drugs, medicaments and biological substances status: Secondary | ICD-10-CM

## 2024-05-17 DIAGNOSIS — E876 Hypokalemia: Secondary | ICD-10-CM | POA: Diagnosis present

## 2024-05-17 DIAGNOSIS — E119 Type 2 diabetes mellitus without complications: Secondary | ICD-10-CM | POA: Diagnosis present

## 2024-05-17 DIAGNOSIS — E785 Hyperlipidemia, unspecified: Secondary | ICD-10-CM | POA: Diagnosis present

## 2024-05-17 DIAGNOSIS — R748 Abnormal levels of other serum enzymes: Secondary | ICD-10-CM | POA: Diagnosis present

## 2024-05-17 DIAGNOSIS — A419 Sepsis, unspecified organism: Secondary | ICD-10-CM | POA: Diagnosis present

## 2024-05-17 DIAGNOSIS — R6521 Severe sepsis with septic shock: Secondary | ICD-10-CM | POA: Diagnosis present

## 2024-05-17 DIAGNOSIS — Z85820 Personal history of malignant melanoma of skin: Secondary | ICD-10-CM

## 2024-05-17 DIAGNOSIS — Z9181 History of falling: Secondary | ICD-10-CM

## 2024-05-17 DIAGNOSIS — B349 Viral infection, unspecified: Secondary | ICD-10-CM | POA: Diagnosis present

## 2024-05-17 DIAGNOSIS — K921 Melena: Secondary | ICD-10-CM | POA: Diagnosis present

## 2024-05-17 DIAGNOSIS — F10120 Alcohol abuse with intoxication, uncomplicated: Secondary | ICD-10-CM | POA: Diagnosis present

## 2024-05-17 DIAGNOSIS — J159 Unspecified bacterial pneumonia: Secondary | ICD-10-CM | POA: Diagnosis present

## 2024-05-17 DIAGNOSIS — I10 Essential (primary) hypertension: Secondary | ICD-10-CM | POA: Diagnosis present

## 2024-05-17 DIAGNOSIS — Z880 Allergy status to penicillin: Secondary | ICD-10-CM

## 2024-05-17 DIAGNOSIS — R7401 Elevation of levels of liver transaminase levels: Secondary | ICD-10-CM | POA: Diagnosis present

## 2024-05-17 DIAGNOSIS — E878 Other disorders of electrolyte and fluid balance, not elsewhere classified: Secondary | ICD-10-CM | POA: Diagnosis present

## 2024-05-17 DIAGNOSIS — K5730 Diverticulosis of large intestine without perforation or abscess without bleeding: Secondary | ICD-10-CM | POA: Diagnosis present

## 2024-05-17 DIAGNOSIS — G894 Chronic pain syndrome: Secondary | ICD-10-CM | POA: Diagnosis present

## 2024-05-17 DIAGNOSIS — D500 Iron deficiency anemia secondary to blood loss (chronic): Secondary | ICD-10-CM | POA: Diagnosis not present

## 2024-05-17 DIAGNOSIS — Z7982 Long term (current) use of aspirin: Secondary | ICD-10-CM

## 2024-05-17 DIAGNOSIS — F4310 Post-traumatic stress disorder, unspecified: Secondary | ICD-10-CM | POA: Diagnosis present

## 2024-05-17 DIAGNOSIS — F17210 Nicotine dependence, cigarettes, uncomplicated: Secondary | ICD-10-CM | POA: Diagnosis present

## 2024-05-17 DIAGNOSIS — D7589 Other specified diseases of blood and blood-forming organs: Secondary | ICD-10-CM | POA: Diagnosis present

## 2024-05-17 DIAGNOSIS — Z950 Presence of cardiac pacemaker: Secondary | ICD-10-CM

## 2024-05-17 DIAGNOSIS — K219 Gastro-esophageal reflux disease without esophagitis: Secondary | ICD-10-CM | POA: Diagnosis present

## 2024-05-17 LAB — SALICYLATE LEVEL: Salicylate Lvl: 21.4 mg/dL (ref 7.0–30.0)

## 2024-05-17 LAB — CBC WITH DIFFERENTIAL/PLATELET
Abs Immature Granulocytes: 0.39 K/uL — ABNORMAL HIGH (ref 0.00–0.07)
Basophils Absolute: 0 K/uL (ref 0.0–0.1)
Basophils Relative: 0 %
Eosinophils Absolute: 0 K/uL (ref 0.0–0.5)
Eosinophils Relative: 0 %
HCT: 37.5 % — ABNORMAL LOW (ref 39.0–52.0)
Hemoglobin: 13.6 g/dL (ref 13.0–17.0)
Immature Granulocytes: 5 %
Lymphocytes Relative: 7 %
Lymphs Abs: 0.5 K/uL — ABNORMAL LOW (ref 0.7–4.0)
MCH: 36.9 pg — ABNORMAL HIGH (ref 26.0–34.0)
MCHC: 36.3 g/dL — ABNORMAL HIGH (ref 30.0–36.0)
MCV: 101.6 fL — ABNORMAL HIGH (ref 80.0–100.0)
Monocytes Absolute: 0.3 K/uL (ref 0.1–1.0)
Monocytes Relative: 3 %
Neutro Abs: 6.2 K/uL (ref 1.7–7.7)
Neutrophils Relative %: 85 %
Platelets: 184 K/uL (ref 150–400)
RBC: 3.69 MIL/uL — ABNORMAL LOW (ref 4.22–5.81)
RDW: 16.3 % — ABNORMAL HIGH (ref 11.5–15.5)
WBC: 7.3 K/uL (ref 4.0–10.5)
nRBC: 0.8 % — ABNORMAL HIGH (ref 0.0–0.2)

## 2024-05-17 LAB — TYPE AND SCREEN
ABO/RH(D): O POS
Antibody Screen: NEGATIVE

## 2024-05-17 LAB — URINALYSIS, ROUTINE W REFLEX MICROSCOPIC
Bilirubin Urine: NEGATIVE
Glucose, UA: NEGATIVE mg/dL
Hgb urine dipstick: NEGATIVE
Ketones, ur: 20 mg/dL — AB
Leukocytes,Ua: NEGATIVE
Nitrite: NEGATIVE
Protein, ur: NEGATIVE mg/dL
Specific Gravity, Urine: 1.028 (ref 1.005–1.030)
pH: 5 (ref 5.0–8.0)

## 2024-05-17 LAB — LACTIC ACID, PLASMA
Lactic Acid, Venous: 2.2 mmol/L (ref 0.5–1.9)
Lactic Acid, Venous: 1.7 mmol/L (ref 0.5–1.9)

## 2024-05-17 MED ORDER — SODIUM CHLORIDE 0.9 % IV SOLN: INTRAVENOUS | Status: DC

## 2024-05-17 MED ORDER — SODIUM CHLORIDE 0.9 % IV SOLN
500.0000 mg | INTRAVENOUS | Status: AC
  Administered 2024-05-18 – 2024-05-21 (×6): 500 mg via INTRAVENOUS
  Filled 2024-05-17 (×4): qty 5

## 2024-05-17 MED ORDER — SERTRALINE HCL 50 MG PO TABS: 100.0000 mg | ORAL_TABLET | Freq: Every day | ORAL | Status: DC

## 2024-05-17 MED ORDER — GUAIFENESIN ER 600 MG PO TB12
600.0000 mg | ORAL_TABLET | Freq: Two times a day (BID) | ORAL | Status: DC
  Administered 2024-05-17 – 2024-05-22 (×15): 600 mg via ORAL
  Filled 2024-05-17 (×10): qty 1

## 2024-05-17 MED ORDER — PANTOPRAZOLE SODIUM 40 MG IV SOLR
40.0000 mg | Freq: Two times a day (BID) | INTRAVENOUS | Status: DC
  Administered 2024-05-18 – 2024-05-22 (×15): 40 mg via INTRAVENOUS
  Filled 2024-05-17 (×10): qty 10

## 2024-05-17 MED ORDER — HYDROCOD POLI-CHLORPHE POLI ER 10-8 MG/5ML PO SUER
5.0000 mL | Freq: Two times a day (BID) | ORAL | Status: DC | PRN
  Administered 2024-05-20 (×2): 5 mL via ORAL
  Filled 2024-05-17: qty 5

## 2024-05-17 MED ORDER — POTASSIUM CHLORIDE 20 MEQ PO PACK
40.0000 meq | PACK | Freq: Once | ORAL | Status: AC
  Administered 2024-05-18: 40 meq via ORAL
  Filled 2024-05-17: qty 2

## 2024-05-17 MED ORDER — TUSSIONEX PENNKINETIC ER 10-8 MG/5ML PO SUER: 5.0000 mL | Freq: Two times a day (BID) | ORAL | Status: DC

## 2024-05-17 MED ORDER — TRAZODONE HCL 100 MG PO TABS: 100.0000 mg | ORAL_TABLET | Freq: Every day | ORAL | Status: DC

## 2024-05-17 MED ORDER — IPRATROPIUM-ALBUTEROL 0.5-2.5 (3) MG/3ML IN SOLN
3.0000 mL | Freq: Four times a day (QID) | RESPIRATORY_TRACT | Status: DC
  Administered 2024-05-17 – 2024-05-19 (×6): 3 mL via RESPIRATORY_TRACT
  Filled 2024-05-17 (×6): qty 3

## 2024-05-17 MED ORDER — ACETAMINOPHEN 325 MG PO TABS
650.0000 mg | ORAL_TABLET | Freq: Four times a day (QID) | ORAL | Status: DC | PRN
Start: 2024-05-17 — End: 2024-05-22
  Administered 2024-05-18 – 2024-05-22 (×3): 650 mg via ORAL
  Filled 2024-05-17 (×2): qty 2

## 2024-05-17 MED ORDER — DIAZEPAM 5 MG PO TABS: 5.0000 mg | ORAL_TABLET | Freq: Two times a day (BID) | ORAL | Status: DC

## 2024-05-17 MED ORDER — ZOLPIDEM TARTRATE 5 MG PO TABS
5.0000 mg | ORAL_TABLET | Freq: Every evening | ORAL | Status: DC | PRN
  Administered 2024-05-20 (×2): 5 mg via ORAL
  Filled 2024-05-17: qty 1

## 2024-05-17 MED ORDER — SODIUM CHLORIDE 0.9 % IV SOLN
500.0000 mg | Freq: Once | INTRAVENOUS | Status: AC
  Administered 2024-05-17: 500 mg via INTRAVENOUS
  Filled 2024-05-17: qty 5

## 2024-05-17 MED ORDER — MORPHINE SULFATE ER 15 MG PO TBCR: 15.0000 mg | EXTENDED_RELEASE_TABLET | Freq: Two times a day (BID) | ORAL | Status: DC

## 2024-05-17 MED ORDER — AMLODIPINE BESYLATE 5 MG PO TABS
10.0000 mg | ORAL_TABLET | Freq: Every day | ORAL | Status: DC
  Administered 2024-05-18: 10 mg via ORAL
  Filled 2024-05-17: qty 2

## 2024-05-17 MED ORDER — POTASSIUM CHLORIDE 10 MEQ/100ML IV SOLN
10.0000 meq | INTRAVENOUS | Status: AC
  Administered 2024-05-17 (×4): 10 meq via INTRAVENOUS
  Filled 2024-05-17 (×4): qty 100

## 2024-05-17 MED ORDER — LACTATED RINGERS IV BOLUS
1000.0000 mL | Freq: Once | INTRAVENOUS | Status: AC
  Administered 2024-05-17: 1000 mL via INTRAVENOUS

## 2024-05-17 MED ORDER — POTASSIUM CHLORIDE CRYS ER 20 MEQ PO TBCR
20.0000 meq | EXTENDED_RELEASE_TABLET | Freq: Every day | ORAL | Status: DC
  Filled 2024-05-17: qty 1

## 2024-05-17 MED ORDER — ONDANSETRON HCL 4 MG/2ML IJ SOLN: 4.0000 mg | Freq: Four times a day (QID) | INTRAMUSCULAR | Status: DC | PRN

## 2024-05-17 MED ORDER — SODIUM CHLORIDE 0.9 % IV SOLN
1.0000 g | Freq: Once | INTRAVENOUS | Status: AC
  Administered 2024-05-17: 1 g via INTRAVENOUS
  Filled 2024-05-17: qty 10

## 2024-05-17 MED ORDER — LORATADINE 10 MG PO TABS: 10.0000 mg | ORAL_TABLET | Freq: Every day | ORAL | Status: DC

## 2024-05-17 MED ORDER — SODIUM CHLORIDE 0.9 % IV SOLN
1.0000 g | Freq: Once | INTRAVENOUS | Status: DC
  Filled 2024-05-17: qty 10

## 2024-05-17 MED ORDER — LOSARTAN POTASSIUM 50 MG PO TABS: 50.0000 mg | ORAL_TABLET | Freq: Every day | ORAL | Status: DC

## 2024-05-17 MED ORDER — ONDANSETRON HCL 4 MG PO TABS: 4.0000 mg | ORAL_TABLET | Freq: Four times a day (QID) | ORAL | Status: DC | PRN

## 2024-05-17 MED ORDER — PANTOPRAZOLE SODIUM 40 MG IV SOLR
40.0000 mg | Freq: Once | INTRAVENOUS | Status: AC
  Administered 2024-05-17: 40 mg via INTRAVENOUS
  Filled 2024-05-17: qty 10

## 2024-05-17 MED ORDER — SODIUM CHLORIDE 0.9 % IV SOLN
2.0000 g | INTRAVENOUS | Status: AC
  Administered 2024-05-18 – 2024-05-21 (×6): 2 g via INTRAVENOUS
  Filled 2024-05-17 (×4): qty 20

## 2024-05-17 MED ORDER — IPRATROPIUM-ALBUTEROL 0.5-2.5 (3) MG/3ML IN SOLN: 3.0000 mL | RESPIRATORY_TRACT | Status: DC | PRN

## 2024-05-17 MED ORDER — TAMSULOSIN HCL 0.4 MG PO CAPS: 0.4000 mg | ORAL_CAPSULE | Freq: Every day | ORAL | Status: DC

## 2024-05-17 MED ORDER — POTASSIUM CHLORIDE IN NACL 20-0.9 MEQ/L-% IV SOLN
INTRAVENOUS | Status: AC
  Filled 2024-05-17 (×3): qty 1000

## 2024-05-17 MED ORDER — ATORVASTATIN CALCIUM 20 MG PO TABS: 10.0000 mg | ORAL_TABLET | Freq: Every day | ORAL | Status: DC

## 2024-05-17 MED ORDER — GABAPENTIN 100 MG PO CAPS
100.0000 mg | ORAL_CAPSULE | Freq: Three times a day (TID) | ORAL | Status: DC
  Administered 2024-05-18 – 2024-05-22 (×20): 100 mg via ORAL
  Filled 2024-05-17 (×13): qty 1

## 2024-05-17 MED ORDER — ALBUTEROL SULFATE HFA 108 (90 BASE) MCG/ACT IN AERS: 2.0000 | INHALATION_SPRAY | RESPIRATORY_TRACT | Status: DC | PRN

## 2024-05-17 MED ORDER — QUETIAPINE FUMARATE 25 MG PO TABS
100.0000 mg | ORAL_TABLET | Freq: Every day | ORAL | Status: DC
Start: 2024-05-18 — End: 2024-05-22
  Administered 2024-05-18 – 2024-05-21 (×7): 100 mg via ORAL
  Filled 2024-05-17 (×5): qty 4

## 2024-05-17 MED ORDER — FLUTICASONE PROPIONATE 50 MCG/ACT NA SUSP
1.0000 | Freq: Every day | NASAL | Status: DC
  Administered 2024-05-19 – 2024-05-22 (×7): 1 via NASAL
  Filled 2024-05-17 (×2): qty 16

## 2024-05-17 MED ORDER — HYDROCHLOROTHIAZIDE 12.5 MG PO TABS: 12.5000 mg | ORAL_TABLET | Freq: Every day | ORAL | Status: DC

## 2024-05-17 MED ORDER — INSULIN ASPART 100 UNIT/ML IJ SOLN
0.0000 [IU] | INTRAMUSCULAR | Status: DC
  Administered 2024-05-18: 1 [IU] via SUBCUTANEOUS
  Filled 2024-05-17: qty 1

## 2024-05-17 MED ORDER — FENTANYL CITRATE PF 50 MCG/ML IJ SOSY
50.0000 ug | PREFILLED_SYRINGE | Freq: Once | INTRAMUSCULAR | Status: AC
  Administered 2024-05-17: 50 ug via INTRAVENOUS
  Filled 2024-05-17: qty 1

## 2024-05-17 MED ORDER — ACETAMINOPHEN 650 MG RE SUPP: 650.0000 mg | Freq: Four times a day (QID) | RECTAL | Status: DC | PRN

## 2024-05-17 MED ORDER — OXYCODONE-ACETAMINOPHEN 5-325 MG PO TABS: 1.0000 | ORAL_TABLET | ORAL | Status: DC | PRN

## 2024-05-17 NOTE — Assessment & Plan Note (Addendum)
 Per patient he was having some melena, no NSAID use. Hemoglobin with some decreased to 11.1 this morning, it was 13.6 on admission, might be some dilutional effect. GI was consulted. - Monitor hemoglobin -Transfuse if below 7

## 2024-05-17 NOTE — Assessment & Plan Note (Addendum)
 This is likely prerenal due to volume depletion and dehydration. Renal ultrasound was negative for any acute abnormality, did show bilateral simple renal cyst with no follow-up recommendations.  Small improvement of creatinine to 1.87 today.  Creatinine less than 1 at baseline -Continue with IV fluid -Monitor renal function -Avoid nephrotoxins

## 2024-05-17 NOTE — Assessment & Plan Note (Addendum)
 Blood pressure within lower normal goal.  Patient was on losartan , HCTZ, amlodipine , metoprolol  and Minipress at home - Holding home antihypertensives -Starting low-dose metoprolol  -Monitor blood pressure closely

## 2024-05-17 NOTE — ED Notes (Signed)
 Pt to CT

## 2024-05-17 NOTE — ED Notes (Signed)
 Pt reports to this RN that he thinks he has had a bowel movement. This RN and Manuelita, NT in room. Pt noted to have scant amount of feces in brief. This RN cleaned pt with bath wipes and applied barrier cream. Pt placed on new chux pad and adjusted in the bed.

## 2024-05-17 NOTE — H&P (Signed)
 David Christensen   PATIENT NAME: David Christensen    MR#:  [id number]  DATE OF BIRTH:  May 19, 1951  DATE OF ADMISSION:  05/17/2024  PRIMARY CARE PHYSICIAN: Pablo Perkins, FNP   Patient is coming from: Home  REQUESTING/REFERRING PHYSICIAN: Jossie Birmingham, MD  CHIEF COMPLAINT:   Chief Complaint  Patient presents with   Fall   Abdominal Pain   Headache    HISTORY OF PRESENT ILLNESS:  David Christensen is a 73 y.o. Caucasian male with medical history significant for GERD, depression, type 2 diabetes mellitus, hypertension and PTSD, who presented to the emergency room with acute onset of diarrhea over the last several weeks and recent melena with no bright red bleeding per rectum.  He has been having mild left lower quadrant lower abdominal pain and generalized weakness and fatigue has been worsening lately.  He has been having difficulty standing without assistance and had multiple falls over the past few days including today.SABRA  He admitted to cough without significant dyspnea or wheezing.  He has been having nausea and vomiting with mild heartburn.  He stated that his last alcoholic drink was 6 months ago.  No paresthesias or focal muscle weakness.  No tinnitus or vertigo.  No fever or chills.  No dysuria, oliguria or hematuria or flank pain.  No chest pain or palpitations.  ED Course: When he came to the ER, heart rate was 119 with respiratory rate of 24 and BP 115/54 pulse currently 99% on 2 L of O2 by nasal cannula.  Labs revealed hyponatremia 133 and hypochloremia 95 and severe hypokalemia with potassium less than 2 and CO2 was 13 BUN of 59 creatinine 2.27 and glucose 124.  BUN was 59 creatinine 2.27 which were previously normal a month ago.  Calcium  was 8.8 and anion gap 25.  AST was significantly elevated at 516 ALT 473 total protein 6.2 and total bili 1.9.  Lactic acid was 2.1 later 1.7.  CBC showed hemoglobin 13.6 close to previous level and hematocrit 37.5.  This was associated with  macrocytosis.  Blood group was O+ with negative antibody screen. EKG as reviewed by me : EKG showed sinus tachycardia with rate 108 with irregular rate likely sinus arrhythmia.  With probable progression. Imaging: Bilateral feet x-ray showed no acute fracture or dislocation.  Bilateral elbow x-ray showed the following: Both lateral radiographs are suboptimal due to patient positioning, which limits evaluation for a nondisplaced elbow fracture. No large elbow joint effusion, acute, displaced fracture, or dislocation in either elbow. If concern persists for elbow joint fracture, repeat imaging with improved positioning on the lateral radiographs would be recommended.  Abdominal pelvic CT scan without contrast revealed the following: 1. Left lower lobe ground glass and consolidation airspace opacities. Findings suggestive of infection/inflammation. 2. Second portion of the duodenum diverticula and colonic diverticulosis with no acute diverticulitis. 3.  Aortic Atherosclerosis (ICD10-I70.0). 4. Limited evaluation on this noncontrast study.  The patient was given 40 mg of IV Protonix  and 1 L bolus of IV lactated ringer , 50 mcg of IV fentanyl  and IV Rocephin  and Zithromax .  He will be admitted to a progressive unit bed for further evaluation and management. PAST MEDICAL HISTORY:   Past Medical History:  Diagnosis Date   Acid reflux    Cancer (HCC)    skin cancer   Depression    Diabetes mellitus without complication (HCC)    Hypertension    PTSD (post-traumatic stress disorder)     PAST SURGICAL  HISTORY:   Past Surgical History:  Procedure Laterality Date   BACK SURGERY     CHOLECYSTECTOMY N/A 02/28/2015   Procedure: LAPAROSCOPIC CHOLECYSTECTOMY;  Surgeon: Lonni Brands, MD;  Location: [facility] ORS;  Service: General;  Laterality: N/A;   MELANOMA EXCISION     PACEMAKER INSERTION Left    PROSTATECTOMY     TONSILLECTOMY AND ADENOIDECTOMY      SOCIAL HISTORY:   Social History    Tobacco Use   Smoking status: Every Day    Types: Cigarettes   Smokeless tobacco: Never  Substance Use Topics   Alcohol use: Yes    Alcohol/week: 2.0 standard drinks of alcohol    Types: 2 Shots of liquor per week    FAMILY HISTORY:   Family History  Problem Relation Age of Onset   Lung disease Mother    Stroke Father     DRUG ALLERGIES:   Allergies  Allergen Reactions   Alpha-Gal Other (See Comments)    NO RED MEAT; sever stomach upset for up to 7 days   Metformin And Related Diarrhea   Penicillins Diarrhea   Zolpidem  Other (See Comments)    Other reaction(s): OTHER    REVIEW OF SYSTEMS:   ROS As per history of present illness. All pertinent systems were reviewed above. Constitutional, HEENT, cardiovascular, respiratory, GI, GU, musculoskeletal, neuro, psychiatric, endocrine, integumentary and hematologic systems were reviewed and are otherwise negative/unremarkable except for positive findings mentioned above in the HPI.   MEDICATIONS AT HOME:   Prior to Admission medications   Medication Sig Start Date End Date Taking? Authorizing Provider  acetaminophen  (TYLENOL ) 500 MG tablet Take 1,000 mg by mouth daily.   Yes [provider]  albuterol  (PROVENTIL  HFA;VENTOLIN  HFA) 108 (90 Base) MCG/ACT inhaler Inhale 2 puffs into the lungs every 4 (four) hours as needed for wheezing or shortness of breath. 12/12/17  Yes Triplett, Cari B, FNP  amLODipine  (NORVASC ) 10 MG tablet Take 10 mg by mouth daily. 02/10/15 05/17/24 Yes [provider]  aspirin EC 81 MG tablet Take 81 mg by mouth daily. 04/25/12  Yes [provider]  atorvastatin  (LIPITOR) 10 MG tablet Take 10 mg by mouth daily.   Yes [provider]  EPINEPHrine  0.3 mg/0.3 mL IJ SOAJ injection Inject 0.3 mg into the muscle as needed for anaphylaxis. INJECT 1 SYRINGE INTO OUTER THIGH ONCE AS NEEDED FOR SEVERE ALLERGIC REACTION. 12/04/23  Yes [provider]  fluticasone  (FLONASE ) 50  MCG/ACT nasal spray Place 2 sprays into the nose at bedtime. 11/06/14  Yes [provider]  gabapentin  (NEURONTIN ) 100 MG capsule Take 100 mg by mouth 3 (three) times daily. 02/21/24 02/20/25 Yes [provider]  hydrochlorothiazide  (HYDRODIURIL ) 12.5 MG tablet Take 1 tablet by mouth daily. 04/08/20  Yes [provider]  lidocaine  (XYLOCAINE ) 5 % ointment Apply 1 Application topically 3 (three) times daily. 03/12/24 03/12/25 Yes [provider]  losartan  (COZAAR ) 50 MG tablet Take 50 mg by mouth daily.   Yes [provider]  meloxicam  (MOBIC ) 7.5 MG tablet Take 7.5 mg by mouth daily. 04/17/24 04/17/25 Yes [provider]  metoprolol succinate (TOPROL-XL) 25 MG 24 hr tablet Take 25 mg by mouth daily. 04/01/23  Yes [provider]  naloxone [facility]) nasal spray 4 mg/0.1 mL One spray in either nostril once for known/suspected opioid overdose. May repeat every 2-3 minutes in alternating nostril til EMS arrives 12/26/23  Yes [provider]  prazosin (MINIPRESS) 2 MG capsule Take 4 mg  by mouth 2 (two) times daily. 03/19/20  Yes [provider]  cetirizine (ZYRTEC) 10 MG tablet Take 10 mg by mouth daily.    [provider]  chlorpheniramine-HYDROcodone (TUSSIONEX PENNKINETIC  ER) 10-8 MG/5ML SUER Take 5 mLs by mouth 2 (two) times daily. 02/13/20   Trudy Dorn BRAVO, MD  diazepam  (VALIUM ) 5 MG tablet Take 5 mg by mouth 2 (two) times daily.    [provider]  diclofenac Sodium (VOLTAREN) 1 % GEL Apply 4 g topically 4 (four) times daily.    [provider]  morphine  (MS CONTIN ) 15 MG 12 hr tablet Take 15 mg by mouth every 12 (twelve) hours.    [provider]  omeprazole (PRILOSEC) 20 MG capsule Take 20 mg by mouth daily. 11/19/12   [provider]  oxyCODONE -acetaminophen  (PERCOCET) 10-325 MG per tablet Take 1 tablet by mouth every 6 (six) hours as needed.    [provider]   oxyCODONE -acetaminophen  (PERCOCET/ROXICET) 5-325 MG per tablet Take 1-2 tablets by mouth every 4 (four) hours as needed for moderate pain. 03/01/15   Ermelinda Bruckner, MD  pantoprazole  (PROTONIX ) 40 MG tablet Take 40 mg by mouth 2 (two) times daily.    [provider]  potassium chloride  SA (K-DUR,KLOR-CON ) 20 MEQ tablet Take 1 tablet by mouth daily. 02/13/15   [provider]  predniSONE  (STERAPRED UNI-PAK 21 TAB) 10 MG (21) TBPK tablet Take by mouth daily. Dispense 12 day prednisone  taper as directed 02/13/20   Trudy Dorn BRAVO, MD  pregabalin  (LYRICA ) 100 MG capsule Take 1 capsule by mouth. 01/19/15   [provider]  QUEtiapine  (SEROQUEL ) 100 MG tablet TAKE 2&1/2 TABLETS BY MOUTH AT BEDTIME 03/23/23   [provider]  senna (SENOKOT) 8.6 MG TABS tablet Take 1 tablet by mouth daily. 02/24/15   [provider]  sertraline  (ZOLOFT ) 100 MG tablet Take 100 mg by mouth daily. 04/25/12   [provider]  tamsulosin  (FLOMAX ) 0.4 MG CAPS capsule Take 1 capsule by mouth daily.    [provider]  traZODone  (DESYREL ) 100 MG tablet Take 1 tablet by mouth at bedtime. 12/14/14   [provider]  zolpidem  (AMBIEN ) 10 MG tablet Take 10 mg by mouth at bedtime as needed.    [provider]      VITAL SIGNS:  Blood pressure (!) 134/56, pulse (!) 113, temperature 98.5 F (36.9 C), resp. rate 20, SpO2 100%.  PHYSICAL EXAMINATION:  Physical Exam  GENERAL:  73 y.o.-year-old Caucasian male patient lying in the bed with no acute distress.  EYES: Pupils equal, round, reactive to light and accommodation. No scleral icterus. Extraocular muscles intact.  HEENT: Head atraumatic, normocephalic. Oropharynx and nasopharynx clear.  NECK:  Supple, no jugular venous distention. No thyroid enlargement, no tenderness.  LUNGS: Diminished left basal breath sounds with left basal crackles.  No use of accessory muscles of respiration.   CARDIOVASCULAR: Regular rate and rhythm, S1, S2 normal. No murmurs, rubs, or gallops.  ABDOMEN: Soft, nondistended, with mild left lower quadrant tenderness without rebound tenderness guarding or rigidity.. Bowel sounds present. No organomegaly or mass.  EXTREMITIES: No pedal edema, cyanosis, or clubbing.  NEUROLOGIC: Cranial nerves II through XII are intact. Muscle strength 5/5 in all extremities. Sensation intact. Gait not checked.  PSYCHIATRIC: The patient is alert and oriented x 3.  Normal affect and good eye contact. SKIN: No obvious rash, lesion, or ulcer.   LABORATORY PANEL:   CBC Recent Labs  Lab 05/17/24 1707  WBC 7.3  HGB 13.6  HCT 37.5*  PLT 184   ------------------------------------------------------------------------------------------------------------------  Chemistries  Recent Labs  Lab 05/17/24 1829  NA 133*  K <2.0*  CL 95*  CO2 13*  GLUCOSE 124*  BUN 59*  CREATININE 2.27*  CALCIUM  8.8*  AST 560*  ALT 473*  ALKPHOS 109  BILITOT 1.9*   ------------------------------------------------------------------------------------------------------------------  Cardiac Enzymes No results for input(s): TROPONINI in the last 168 hours. ------------------------------------------------------------------------------------------------------------------  RADIOLOGY:  CT ABDOMEN PELVIS WO CONTRAST Result Date: 05/17/2024 CLINICAL DATA:  LLQ abdominal pain hemoccult positive diarrhea x 3 weeks EXAM: CT ABDOMEN AND PELVIS WITHOUT CONTRAST TECHNIQUE: Multidetector CT imaging of the abdomen and pelvis was performed following the standard protocol without IV contrast. RADIATION DOSE REDUCTION: This exam was performed according to the departmental dose-optimization program which includes automated exposure control, adjustment of the mA and/or kV according to patient size and/or use of iterative reconstruction technique. COMPARISON:  CT angio chest 02/13/2020 FINDINGS: Lower  chest: Left lower lobe ground glass and consolidation airspace opacities. Right lower lobe bronchiole wall thickening. Cardiac leads. Hepatobiliary: No focal liver abnormality. Status post cholecystectomy. No biliary dilatation. Pancreas: No focal lesion. Normal pancreatic contour. No surrounding inflammatory changes. No main pancreatic ductal dilatation. Spleen: Normal in size without focal abnormality. Adrenals/Urinary Tract: Bilateral adrenal gland nodule hyperplasia. 1.1 cm right fluid density adrenal gland nodule likely representing an adenoma-no further follow-up indicated. Bilateral kidneys enhance symmetrically. Fluid density and hemorrhagic/proteinaceous cysts- in the absence of clinically indicated signs/symptoms, require no independent follow-up. No hydronephrosis. No hydroureter.  No nephroureterolithiasis. The urinary bladder is unremarkable. Stomach/Bowel: Stomach is within normal limits. Second portion of the duodenum diverticula no evidence of bowel wall thickening or dilatation. Colonic diverticulosis. Appendix appears normal. Vascular/Lymphatic: No abdominal aorta or iliac aneurysm. Severe atherosclerotic plaque of the aorta and its branches. No abdominal, pelvic, or inguinal lymphadenopathy. Reproductive: No mass. Prostate not visualized and likely surgically removed. Other: No intraperitoneal free fluid. No intraperitoneal free gas. No organized fluid collection. Musculoskeletal: No abdominal wall hernia or abnormality. No suspicious lytic or blastic osseous lesions. No acute displaced fracture. L5-S1 degenerative changes. IMPRESSION: 1. Left lower lobe ground glass and consolidation airspace opacities. Findings suggestive of infection/inflammation. 2. Second portion of the duodenum diverticula and colonic diverticulosis with no acute diverticulitis. 3.  Aortic Atherosclerosis (ICD10-I70.0). 4. Limited evaluation on this noncontrast study. Electronically Signed   By: Morgane  Naveau M.D.   On:  05/17/2024 20:59   DG Elbow 2 Views Left Result Date: 05/17/2024 CLINICAL DATA:  fall w/ BL foot and elbow pain EXAM: LEFT ELBOW - 2 VIEW COMPARISON:  None Available. FINDINGS: Right elbow No acute fracture or dislocation. While the lateral radiograph is suboptimal due to patient positioning, no large elbow joint effusion is visualized. Soft tissues are unremarkable. Left elbow No acute fracture or dislocation. While the lateral radiograph is suboptimal due to patient positioning, no large elbow joint effusion is visualized. Soft tissues are unremarkable. IMPRESSION: Both lateral radiographs are suboptimal due to patient positioning, which limits evaluation for a nondisplaced elbow fracture. No large elbow joint effusion, acute, displaced fracture, or dislocation in either elbow. If concern persists for elbow joint fracture, repeat imaging with improved positioning on the lateral radiographs would be recommended. Electronically Signed   By: Rogelia Myers M.D.   On: 05/17/2024 18:01   DG Elbow 2 Views Right Result Date: 05/17/2024 CLINICAL DATA:  fall w/ BL foot and elbow pain EXAM: RIGHT ELBOW - 2 VIEW COMPARISON:  None  Available. FINDINGS: Right elbow No acute fracture or dislocation. While the lateral radiograph is suboptimal due to patient positioning, no large elbow joint effusion is visualized. Soft tissues are unremarkable. Left elbow No acute fracture or dislocation. While the lateral radiograph is suboptimal due to patient positioning, no large elbow joint effusion is visualized. Soft tissues are unremarkable. IMPRESSION: Both lateral radiographs are suboptimal due to patient positioning, which limits evaluation for a nondisplaced elbow fracture. No large elbow joint effusion, acute, displaced fracture, or dislocation in either elbow. If concern persists for elbow joint fracture, repeat imaging with improved positioning on the lateral radiographs would be recommended. Electronically Signed   By: Rogelia Myers M.D.   On: 05/17/2024 18:00   DG Foot Complete Left Result Date: 05/17/2024 CLINICAL DATA:  fall w/ BL foot and elbow pain EXAM: LEFT FOOT - COMPLETE 3+ VIEW COMPARISON:  None Available. FINDINGS: No acute fracture or dislocation. Small undersurface calcaneal heel spur. Small Achilles insertion enthesophyte. Soft tissues are unremarkable. No radiopaque foreign body. IMPRESSION: No acute fracture or dislocation. Electronically Signed   By: Rogelia Myers M.D.   On: 05/17/2024 17:57   DG Foot Complete Right Result Date: 05/17/2024 CLINICAL DATA:  fall w/ BL foot and elbow pain EXAM: RIGHT FOOT COMPLETE - 3+ VIEW COMPARISON:  None Available. FINDINGS: No acute fracture or dislocation. There is no evidence of arthropathy or other focal bone abnormality. Soft tissues are unremarkable. No radiopaque foreign body. IMPRESSION: No acute fracture or dislocation. Electronically Signed   By: Rogelia Myers M.D.   On: 05/17/2024 17:56      IMPRESSION AND PLAN:  Assessment and Plan: * GI bleeding - The patient be admitted to a progressive unit bed. - Will follow serial hemoglobins and hematocrits. - Will continue IV Protonix . - The patient was typed and crossmatch. - His hemoglobin and hematocrit are currently stable. - GI consult to be obtained. - I notified Dr. Aundria about the patient  Elevated LFTs - This is mainly elevated transaminases. - This could be related to significant volume depletion and hepatorenal syndrome. - GI consult to be obtained as mentioned above. - Will follow LFTs with hydration. - Will avoid hepatotoxins.  AKI (acute kidney injury) (HCC) - This is likely prerenal due to volume depletion and dehydration. - The patient will be hydrated with IV normal saline. - Will follow BMP. - Will avoid nephrotoxins. - Will obtain bilateral renal ultrasound.  Hypokalemia - This is severe hypokalemia. - Aggressive potassium replacement will be pursued. - Will follow  magnesium level as well.  Sepsis due to pneumonia (HCC) - Sepsis is manifested by tachypnea and tachycardia. - We will follow blood cultures. - We will continue antibiotic therapy with IV Rocephin  and Zithromax . - Mucolytic therapy and bronchodilator therapy will be provided.  Depression Will continue Zoloft  at and Seroquel  as well as trazodone .  Dyslipidemia - Will hold off statin therapy given significantly elevated LFTs.  Essential hypertension - We will continue antihypertensive therapy.     DVT prophylaxis: Lovenox. Advanced Care Planning:  Code Status: full code. Family Communication:  The plan of care was discussed in details with the patient (and family). I answered all questions. The patient agreed to proceed with the above mentioned plan. Further management will depend upon hospital course. Disposition Plan: Back to previous home environment Consults called: GI All the records are reviewed and case discussed with ED provider.  Status is: Inpatient  At the time of the admission, it appears that  the appropriate admission status for this patient is inpatient.  This is judged to be reasonable and necessary in order to provide the required intensity of service to ensure the patient's safety given the presenting symptoms, physical exam findings and initial radiographic and laboratory data in the context of comorbid conditions.  The patient requires inpatient status due to high intensity of service, high risk of further deterioration and high frequency of surveillance required.  I certify that at the time of admission, it is my clinical judgment that the patient will require inpatient hospital care extending more than 2 midnights.                            Dispo: The patient is from: Home              Anticipated d/c is to: Home              Patient currently is not medically stable to d/c.              Difficult to place patient: No  Madison DELENA Peaches M.D on 05/17/2024 at 11:56  PM  [facility]   From 7 PM-7 AM, contact night-coverage [email or web address]  CC: Primary care physician; Dolleschel, Damien, FNP

## 2024-05-17 NOTE — ED Triage Notes (Signed)
 Chief Complaint  Patient presents with   Fall   Abdominal Pain   Headache   Pt reports to ED with c/o several falls throughout the last week. Pt also c/o generalized abdominal pain and reports he has had diarrhea x3-4 weeks now. Pt noted to have excoriated bottom and barrier cream was applied. Pt reports 7/10 abdominal pain.   BP (!) 115/54   Pulse (!) 119   Temp 98.9 F (37.2 C) (Rectal)   Resp (!) 24   SpO2 99%   Past Medical History:  Diagnosis Date   Acid reflux    Cancer (HCC)    skin cancer   Depression    Diabetes mellitus without complication (HCC)    Hypertension    PTSD (post-traumatic stress disorder)

## 2024-05-17 NOTE — Assessment & Plan Note (Addendum)
 Sepsis is manifested by tachypnea and tachycardia. Chest imaging concerning for pneumonia, preliminary blood cultures negative. -Continue with ceftriaxone  and Zithromax  -Continue with supportive care

## 2024-05-17 NOTE — ED Notes (Signed)
 EKG given to Dr. Jossie at this time.

## 2024-05-17 NOTE — Assessment & Plan Note (Addendum)
 This could be related to significant volume depletion and hepatorenal syndrome.  Slowly improving with IV fluid -Monitor liver function -Holding statin

## 2024-05-17 NOTE — Assessment & Plan Note (Signed)
 Will continue Zoloft  at and Seroquel  as well as trazodone .

## 2024-05-17 NOTE — ED Provider Notes (Signed)
 [facility] Provider Note   Event Date/Time   First MD Initiated Contact with Patient 05/17/24 1646     (approximate) History  Fall, Abdominal Pain, and Headache  HPI Pernell Lenoir is a 73 y.o. male with past medical history of hypertension, GERD, type 2 diabetes, PTSD, and depression who presents via EMS from home complaining of 3 weeks of dark tarry diarrhea with associated worsening generalized weakness and fatigue.  Patient states that he has had multiple falls over the past few days including today.  Patient states that he is having difficulty standing without assistance at this point.  Patient has abrasions to bilateral elbows as well as bilateral feet.  Patient also complains of associated left lower quadrant abdominal pain since this diarrhea began. ROS: Patient currently denies any vision changes, tinnitus, difficulty speaking, facial droop, sore throat, chest pain, shortness of breath,  nausea/vomiting, dysuria, or weakness/numbness/paresthesias in any extremity   Physical Exam  Triage Vital Signs: ED Triage Vitals  Encounter Vitals Group     BP      Girls Systolic BP Percentile      Girls Diastolic BP Percentile      Boys Systolic BP Percentile      Boys Diastolic BP Percentile      Pulse      Resp      Temp      Temp src      SpO2      Weight      Height      Head Circumference      Peak Flow      Pain Score      Pain Loc      Pain Education      Exclude from Growth Chart    Most recent vital signs: Vitals:   05/22/24 0749 05/22/24 1255  BP: 127/67 124/65  Pulse: (!) 59 62  Resp: 17   Temp: (!) 97.4 F (36.3 C) 98.1 F (36.7 C)  SpO2: 98% 92%   General: Awake, oriented x4. CV:  Good peripheral perfusion. Resp:  Normal effort. Abd:  No distention.  Left lower quadrant tenderness to palpation Other:  Elderly overweight Caucasian male resting comfortably in no acute distress ED Results / Procedures / Treatments  Labs (all labs  ordered are listed, but only abnormal results are displayed) Labs Reviewed  C DIFFICILE QUICK SCREEN W PCR REFLEX   - Abnormal; Notable for the following components:      Result Value   C Diff antigen POSITIVE (*)    All other components within normal limits  LACTIC ACID, PLASMA - Abnormal; Notable for the following components:   Lactic Acid, Venous 2.2 (*)    All other components within normal limits  CBC WITH DIFFERENTIAL/PLATELET - Abnormal; Notable for the following components:   RBC 3.69 (*)    HCT 37.5 (*)    MCV 101.6 (*)    MCH 36.9 (*)    MCHC 36.3 (*)    RDW 16.3 (*)    nRBC 0.8 (*)    Lymphs Abs 0.5 (*)    Abs Immature Granulocytes 0.39 (*)    All other components within normal limits  URINALYSIS, ROUTINE W REFLEX MICROSCOPIC - Abnormal; Notable for the following components:   Color, Urine YELLOW (*)    APPearance CLEAR (*)    Ketones, ur 20 (*)    All other components within normal limits  COMPREHENSIVE METABOLIC PANEL WITH GFR - Abnormal; Notable for the  following components:   Sodium 133 (*)    Potassium <2.0 (*)    Chloride 95 (*)    CO2 13 (*)    Glucose, Bld 124 (*)    BUN 59 (*)    Creatinine, Ser 2.27 (*)    Calcium  8.8 (*)    Total Protein 6.2 (*)    AST 560 (*)    ALT 473 (*)    Total Bilirubin 1.9 (*)    GFR, Estimated 29 (*)    Anion gap 25 (*)    All other components within normal limits  BASIC METABOLIC PANEL WITH GFR - Abnormal; Notable for the following components:   Sodium 134 (*)    Potassium 2.3 (*)    CO2 13 (*)    Glucose, Bld 126 (*)    BUN 55 (*)    Creatinine, Ser 1.87 (*)    Calcium  8.1 (*)    GFR, Estimated 37 (*)    Anion gap 23 (*)    All other components within normal limits  CBC - Abnormal; Notable for the following components:   WBC 2.4 (*)    RBC 2.97 (*)    Hemoglobin 11.1 (*)    HCT 30.6 (*)    MCV 103.0 (*)    MCH 37.4 (*)    MCHC 36.3 (*)    RDW 16.7 (*)    Platelets 103 (*)    nRBC 1.7 (*)    All other  components within normal limits  MAGNESIUM  - Abnormal; Notable for the following components:   Magnesium  2.8 (*)    All other components within normal limits  CBC WITH DIFFERENTIAL/PLATELET - Abnormal; Notable for the following components:   RBC 3.41 (*)    Hemoglobin 12.8 (*)    HCT 35.3 (*)    MCV 103.5 (*)    MCH 37.5 (*)    MCHC 36.3 (*)    RDW 16.9 (*)    Platelets 140 (*)    nRBC 0.9 (*)    Lymphs Abs 0.4 (*)    Abs Immature Granulocytes 0.42 (*)    All other components within normal limits  COMPREHENSIVE METABOLIC PANEL WITH GFR - Abnormal; Notable for the following components:   Potassium 2.2 (*)    Chloride 97 (*)    CO2 13 (*)    Glucose, Bld 130 (*)    BUN 58 (*)    Creatinine, Ser 1.84 (*)    Calcium  8.5 (*)    Total Protein 5.7 (*)    Albumin  3.3 (*)    AST 405 (*)    ALT 363 (*)    Total Bilirubin 2.0 (*)    GFR, Estimated 38 (*)    Anion gap 25 (*)    All other components within normal limits  PROTIME-INR - Abnormal; Notable for the following components:   Prothrombin Time 16.5 (*)    INR 1.3 (*)    All other components within normal limits  HEMOGLOBIN AND HEMATOCRIT, BLOOD - Abnormal; Notable for the following components:   Hemoglobin 10.8 (*)    HCT 29.4 (*)    All other components within normal limits  BLOOD GAS, VENOUS - Abnormal; Notable for the following components:   pCO2, Ven 20 (*)    pO2, Ven 50 (*)    Bicarbonate 11.6 (*)    Acid-base deficit 11.3 (*)    All other components within normal limits  POTASSIUM - Abnormal; Notable for the following components:   Potassium 2.9 (*)  All other components within normal limits  HEPATIC FUNCTION PANEL - Abnormal; Notable for the following components:   Total Protein 4.8 (*)    Albumin  2.7 (*)    AST 267 (*)    ALT 286 (*)    Total Bilirubin 2.1 (*)    Indirect Bilirubin 1.9 (*)    All other components within normal limits  PHOSPHORUS - Abnormal; Notable for the following components:    Phosphorus 2.2 (*)    All other components within normal limits  GLUCOSE, CAPILLARY - Abnormal; Notable for the following components:   Glucose-Capillary 105 (*)    All other components within normal limits  GLUCOSE, CAPILLARY - Abnormal; Notable for the following components:   Glucose-Capillary 117 (*)    All other components within normal limits  PHOSPHORUS - Abnormal; Notable for the following components:   Phosphorus <1.0 (*)    All other components within normal limits  CBC - Abnormal; Notable for the following components:   WBC 1.6 (*)    RBC 2.68 (*)    Hemoglobin 10.1 (*)    HCT 27.9 (*)    MCV 104.1 (*)    MCH 37.7 (*)    MCHC 36.2 (*)    RDW 17.0 (*)    Platelets 81 (*)    nRBC 8.0 (*)    All other components within normal limits  COMPREHENSIVE METABOLIC PANEL WITH GFR - Abnormal; Notable for the following components:   CO2 15 (*)    Glucose, Bld 134 (*)    Calcium  7.5 (*)    Total Protein 5.4 (*)    Albumin  2.9 (*)    AST 107 (*)    ALT 184 (*)    Total Bilirubin 1.6 (*)    All other components within normal limits  GLUCOSE, CAPILLARY - Abnormal; Notable for the following components:   Glucose-Capillary 247 (*)    All other components within normal limits  BLOOD GAS, VENOUS - Abnormal; Notable for the following components:   pH, Ven 7.45 (*)    pCO2, Ven 21 (*)    pO2, Ven 55 (*)    Bicarbonate 14.6 (*)    Acid-base deficit 7.2 (*)    All other components within normal limits  COMPREHENSIVE METABOLIC PANEL WITH GFR - Abnormal; Notable for the following components:   Potassium 3.0 (*)    CO2 14 (*)    Glucose, Bld 176 (*)    BUN 25 (*)    Calcium  7.5 (*)    Total Protein 5.3 (*)    Albumin  2.9 (*)    AST 124 (*)    ALT 195 (*)    Total Bilirubin 1.8 (*)    All other components within normal limits  GLUCOSE, CAPILLARY - Abnormal; Notable for the following components:   Glucose-Capillary 171 (*)    All other components within normal limits  GLUCOSE,  CAPILLARY - Abnormal; Notable for the following components:   Glucose-Capillary 137 (*)    All other components within normal limits  POTASSIUM - Abnormal; Notable for the following components:   Potassium 3.3 (*)    All other components within normal limits  PHOSPHORUS - Abnormal; Notable for the following components:   Phosphorus <1.0 (*)    All other components within normal limits  IRON AND TIBC - Abnormal; Notable for the following components:   TIBC 220 (*)    Saturation Ratios 44 (*)    All other components within normal limits  FERRITIN - Abnormal;  Notable for the following components:   Ferritin 605 (*)    All other components within normal limits  RETICULOCYTES - Abnormal; Notable for the following components:   RBC. 2.73 (*)    Immature Retic Fract 39.7 (*)    All other components within normal limits  GLUCOSE, CAPILLARY - Abnormal; Notable for the following components:   Glucose-Capillary 177 (*)    All other components within normal limits  GLUCOSE, CAPILLARY - Abnormal; Notable for the following components:   Glucose-Capillary 140 (*)    All other components within normal limits  BASIC METABOLIC PANEL WITH GFR - Abnormal; Notable for the following components:   Potassium 3.3 (*)    Glucose, Bld 134 (*)    BUN 7 (*)    Creatinine, Ser 0.60 (*)    Calcium  7.6 (*)    All other components within normal limits  PHOSPHORUS - Abnormal; Notable for the following components:   Phosphorus 1.2 (*)    All other components within normal limits  GLUCOSE, CAPILLARY - Abnormal; Notable for the following components:   Glucose-Capillary 162 (*)    All other components within normal limits  GLUCOSE, CAPILLARY - Abnormal; Notable for the following components:   Glucose-Capillary 147 (*)    All other components within normal limits  GLUCOSE, CAPILLARY - Abnormal; Notable for the following components:   Glucose-Capillary 160 (*)    All other components within normal limits   GLUCOSE, CAPILLARY - Abnormal; Notable for the following components:   Glucose-Capillary 153 (*)    All other components within normal limits  CBC - Abnormal; Notable for the following components:   WBC 1.8 (*)    RBC 3.07 (*)    Hemoglobin 11.7 (*)    HCT 32.2 (*)    MCV 104.9 (*)    MCH 38.1 (*)    MCHC 36.3 (*)    RDW 17.9 (*)    Platelets 71 (*)    nRBC 6.6 (*)    All other components within normal limits  GLUCOSE, CAPILLARY - Abnormal; Notable for the following components:   Glucose-Capillary 171 (*)    All other components within normal limits  GLUCOSE, CAPILLARY - Abnormal; Notable for the following components:   Glucose-Capillary 218 (*)    All other components within normal limits  GLUCOSE, CAPILLARY - Abnormal; Notable for the following components:   Glucose-Capillary 140 (*)    All other components within normal limits  PHOSPHORUS - Abnormal; Notable for the following components:   Phosphorus 1.1 (*)    All other components within normal limits  CBC WITH DIFFERENTIAL/PLATELET - Abnormal; Notable for the following components:   WBC 2.3 (*)    RBC 2.66 (*)    Hemoglobin 10.0 (*)    HCT 28.3 (*)    MCV 106.4 (*)    MCH 37.6 (*)    RDW 16.4 (*)    Platelets 72 (*)    nRBC 2.6 (*)    Neutro Abs 1.6 (*)    Lymphs Abs 0.5 (*)    All other components within normal limits  COMPREHENSIVE METABOLIC PANEL WITH GFR - Abnormal; Notable for the following components:   Potassium 3.4 (*)    Glucose, Bld 108 (*)    BUN <5 (*)    Creatinine, Ser 0.50 (*)    Calcium  7.3 (*)    Total Protein 5.0 (*)    Albumin  2.5 (*)    AST 44 (*)    ALT 89 (*)  All other components within normal limits  GLUCOSE, CAPILLARY - Abnormal; Notable for the following components:   Glucose-Capillary 137 (*)    All other components within normal limits  PHOSPHORUS - Abnormal; Notable for the following components:   Phosphorus 1.6 (*)    All other components within normal limits  GLUCOSE,  CAPILLARY - Abnormal; Notable for the following components:   Glucose-Capillary 115 (*)    All other components within normal limits  GLUCOSE, CAPILLARY - Abnormal; Notable for the following components:   Glucose-Capillary 167 (*)    All other components within normal limits  GLUCOSE, CAPILLARY - Abnormal; Notable for the following components:   Glucose-Capillary 129 (*)    All other components within normal limits  BASIC METABOLIC PANEL WITH GFR - Abnormal; Notable for the following components:   Glucose, Bld 126 (*)    BUN <5 (*)    Creatinine, Ser 0.37 (*)    Calcium  7.6 (*)    All other components within normal limits  PHOSPHORUS - Abnormal; Notable for the following components:   Phosphorus 2.4 (*)    All other components within normal limits  GLUCOSE, CAPILLARY - Abnormal; Notable for the following components:   Glucose-Capillary 122 (*)    All other components within normal limits  GLUCOSE, CAPILLARY - Abnormal; Notable for the following components:   Glucose-Capillary 113 (*)    All other components within normal limits  CBC - Abnormal; Notable for the following components:   WBC 2.9 (*)    RBC 2.53 (*)    Hemoglobin 9.7 (*)    HCT 27.5 (*)    MCV 108.7 (*)    MCH 38.3 (*)    RDW 16.5 (*)    Platelets 67 (*)    nRBC 0.7 (*)    All other components within normal limits  GLUCOSE, CAPILLARY - Abnormal; Notable for the following components:   Glucose-Capillary 146 (*)    All other components within normal limits  CBG MONITORING, ED - Abnormal; Notable for the following components:   Glucose-Capillary 127 (*)    All other components within normal limits  CBG MONITORING, ED - Abnormal; Notable for the following components:   Glucose-Capillary 130 (*)    All other components within normal limits  CBG MONITORING, ED - Abnormal; Notable for the following components:   Glucose-Capillary 116 (*)    All other components within normal limits  CBG MONITORING, ED - Abnormal;  Notable for the following components:   Glucose-Capillary 114 (*)    All other components within normal limits  CBG MONITORING, ED - Abnormal; Notable for the following components:   Glucose-Capillary 122 (*)    All other components within normal limits  CULTURE, BLOOD (ROUTINE X 2)  CULTURE, BLOOD (ROUTINE X 2)  GASTROINTESTINAL PANEL BY PCR, STOOL (REPLACES STOOL CULTURE)  RESP PANEL BY RT-PCR (RSV, FLU A&B, COVID)  RVPGX2  CLOSTRIDIUM DIFFICILE BY PCR, REFLEXED  MRSA NEXT GEN BY PCR, NASAL  LACTIC ACID, PLASMA  SALICYLATE LEVEL  LACTIC ACID, PLASMA  APTT  HEMOGLOBIN A1C  AMMONIA  ETHANOL  MAGNESIUM   VITAMIN B12  FOLATE  HEPATITIS PANEL, ACUTE  MAGNESIUM   MAGNESIUM   POTASSIUM  MAGNESIUM   PHOSPHORUS  POTASSIUM  TYPE AND SCREEN   EKG ED ECG REPORT I, Artist MARLA Kerns, the attending physician, personally viewed and interpreted this ECG. Date: 05/17/2024 EKG Time: 1721 Rate: 108 Rhythm: Tachycardic sinus rhythm QRS Axis: normal Intervals: normal ST/T Wave abnormalities: normal Narrative Interpretation: Tachycardic sinus  rhythm.  No evidence of acute ischemia RADIOLOGY ED MD interpretation: X-rays of bilateral feet and elbows showed no evidence of acute abnormalities CT of the abdomen pelvis concerning for lower lobe ground glass opacity/consolidation in the lungs as well as second portion of the duodenal diverticula and chronic diverticulosis without any evidence of diverticulitis - All radiology independently interpreted and agree with radiology assessment Official radiology report(s): No results found. PROCEDURES: Critical Care performed: No Procedures MEDICATIONS ORDERED IN ED: Medications  0.9 % NaCl with KCl 20 mEq/ L  infusion (0 mL/hr Intravenous Stopped 05/18/24 2308)  0.9 %  sodium chloride  infusion (0 mLs Intravenous Stopped 05/19/24 2356)  lactated ringers  bolus 1,000 mL (0 mLs Intravenous Stopped 05/17/24 2225)  fentaNYL  (SUBLIMAZE ) injection 50 mcg (50 mcg  Intravenous Given 05/17/24 2020)  potassium chloride  10 mEq in 100 mL IVPB (0 mEq Intravenous Stopped 05/18/24 0054)  cefTRIAXone  (ROCEPHIN ) 1 g in sodium chloride  0.9 % 100 mL IVPB (0 g Intravenous Stopped 05/17/24 2303)  azithromycin  (ZITHROMAX ) 500 mg in sodium chloride  0.9 % 250 mL IVPB (0 mg Intravenous Stopped 05/18/24 0012)  pantoprazole  (PROTONIX ) injection 40 mg (40 mg Intravenous Given 05/17/24 2225)  potassium chloride  (KLOR-CON ) packet 40 mEq (40 mEq Oral Given 05/18/24 0054)  cefTRIAXone  (ROCEPHIN ) 2 g in sodium chloride  0.9 % 100 mL IVPB (2 g Intravenous New Bag/Given 05/21/24 2159)  azithromycin  (ZITHROMAX ) 500 mg in sodium chloride  0.9 % 250 mL IVPB (500 mg Intravenous New Bag/Given 05/21/24 2202)  albumin  human 25 % solution 25 g (0 g Intravenous Stopped 05/18/24 0800)  potassium chloride  SA (KLOR-CON  M) CR tablet 40 mEq (40 mEq Oral Given 05/18/24 0556)  potassium chloride  10 mEq in 100 mL IVPB (0 mEq Intravenous Stopped 05/18/24 1135)  metoprolol  tartrate (LOPRESSOR ) injection 5 mg (5 mg Intravenous Given 05/18/24 0558)  sodium chloride  0.9 % bolus 1,000 mL (0 mLs Intravenous Stopped 05/18/24 0844)  potassium chloride  SA (KLOR-CON  M) CR tablet 40 mEq (40 mEq Oral Given 05/18/24 1147)  dicyclomine  (BENTYL ) capsule 10 mg (10 mg Oral Given 05/20/24 0819)  metoprolol  tartrate (LOPRESSOR ) tablet 25 mg (25 mg Oral Given 05/18/24 1730)  potassium chloride  SA (KLOR-CON  M) CR tablet 40 mEq (40 mEq Oral Given 05/18/24 2023)  potassium chloride  10 mEq in 100 mL IVPB (0 mEq Intravenous Stopped 05/19/24 1350)  morphine  (PF) 2 MG/ML injection 2 mg (2 mg Intravenous Given 05/19/24 0137)  diazepam  (VALIUM ) injection 2.5 mg (2.5 mg Intravenous Given 05/19/24 0142)  potassium PHOSPHATE  30 mmol in dextrose 5 % 500 mL infusion (0 mmol Intravenous Stopped 05/19/24 1320)  potassium chloride  SA (KLOR-CON  M) CR tablet 40 mEq (40 mEq Oral Given 05/19/24 0842)  sodium phosphate  30 mmol in sodium chloride  0.9 % 250 mL infusion (0 mmol  Intravenous Stopped 05/20/24 1540)  potassium chloride  SA (KLOR-CON  M) CR tablet 40 mEq (40 mEq Oral Given 05/20/24 0819)  potassium PHOSPHATE  30 mmol in dextrose 5 % 500 mL infusion (0 mmol Intravenous Stopped 05/20/24 1539)  magnesium  sulfate IVPB 2 g 50 mL (0 g Intravenous Stopped 05/20/24 1648)  potassium PHOSPHATE  45 mmol in dextrose 5 % 500 mL infusion ( Intravenous Infusion Verify 05/21/24 1600)  potassium chloride  SA (KLOR-CON  M) CR tablet 20 mEq (20 mEq Oral Given 05/21/24 1026)  magnesium  sulfate IVPB 2 g 50 mL (2 g Intravenous New Bag/Given 05/21/24 1027)  potassium PHOSPHATE  25 mmol in dextrose 5 % 500 mL infusion (25 mmol Intravenous New Bag/Given 05/22/24 0040)   IMPRESSION / MDM /  ASSESSMENT AND PLAN / ED COURSE  I reviewed the triage vital signs and the nursing notes.                             The patient is on the cardiac monitor to evaluate for evidence of arrhythmia and/or significant heart rate changes. Patient's presentation is most consistent with acute presentation with potential threat to life or bodily function. + abdominal pain + black stool per rectum Given history and exam patient's presentation most consistent with upper GI bleed possibly secondary to peptic ulcer disease or variceal bleeding. I have low suspicion for aortoenteric fistula, ENT bleeding mimic, Boerhaave's, Pulmonary bleeding mimic.  Workup: CBC, BMP, LFTs, Lipase, PT/INR, Type and Screen  Interventions: Analgesia and antiemetic medications PRN Protonix  40mg  IVP PRBC transfusion Rocephin , azithromycin  Findings: Hb: 13.6  Disposition: Admit for close monitoring.   FINAL CLINICAL IMPRESSION(S) / ED DIAGNOSES   Final diagnoses:  Gastrointestinal hemorrhage, unspecified gastrointestinal hemorrhage type  Sepsis due to pneumonia (HCC)  Hypokalemia  AKI (acute kidney injury) (HCC)  Pancytopenia (HCC)  Elevated LFTs  Essential hypertension  Dyslipidemia   Rx / DC Orders   ED Discharge Orders           Ordered    traMADol  (ULTRAM ) 50 MG tablet  Every 6 hours PRN        05/22/24 1257    feeding supplement (ENSURE PLUS HIGH PROTEIN) LIQD  2 times daily between meals        05/22/24 1257    guaiFENesin  (MUCINEX ) 600 MG 12 hr tablet  2 times daily PRN        05/22/24 1257    Nystatin  (GERHARDT'S BUTT CREAM) CREA  3 times daily        05/22/24 1257    benzonatate  (TESSALON  PERLES) 100 MG capsule  3 times daily PRN        05/22/24 1257    Increase activity slowly        05/22/24 1257    Diet - low sodium heart healthy        05/22/24 1257    Discharge instructions       Comments: It was pleasure taking care of you. You have been given some oxygen to use especially when you are, you can use it at 2 to 3 L and keep your saturation above 90%, hoping it will slowly improve and you do not need to use it duration. You have completed a course of antibiotics while you are in the hospital. You have been given some Mucinex  and Tessalon  to use as needed for cough and phlegm. You are also being given some tramadol  to use as needed for pain, try avoiding Mobic , Goody powder, ibuprofen or Aleve to decrease the risk of ulcers in your stomach and bleeding.  It is important that you follow-up with your gastroenterologist and have a colonoscopy. Please keep yourself well-hydrated and continue taking the rest of your home medications as you are doing it before and follow-up closely with your doctors for further assistance.   05/22/24 1257    Discharge wound care:       Comments: Cleanse buttocks/rectal area with Vashe wound cleanser Soila [phone]) do not rinse and allow to air dry. Apply Gerhardt's Butt Cream to area 3 times a day and prn soiling.  May sprinkle over Gerhardt's with floor stock antifungal powder (microguard green and white label) for extra drying effect.  Every  other day    Comments: Cleanse hand and arm wounds with Vashe, do not rinse.  Apply Xeroform gauze ([place] [phone]) to wound  beds every other day and secure with silicone foam or Kerlix roll gauze whichever is preferred   05/22/24 1257    meloxicam  (MOBIC ) 7.5 MG tablet  Daily PRN        05/22/24 1257    Ambulatory referral to Hematology / Oncology       Comments: For pancytopenia.   05/22/24 1314           Note:  This document was prepared using Dragon voice recognition software and may include unintentional dictation errors.   Robby Bulkley K, MD 05/23/24 [phone]

## 2024-05-17 NOTE — Assessment & Plan Note (Addendum)
 Likely due to GI losses, severe hypokalemia, small improvement and now potassium is at 2.3.  Magnesium  normal. Mild hypophosphatemia -Replete electrolytes aggressively and monitor

## 2024-05-17 NOTE — Assessment & Plan Note (Signed)
-   Will hold off statin therapy given significantly elevated LFTs.

## 2024-05-18 ENCOUNTER — Inpatient Hospital Stay

## 2024-05-18 DIAGNOSIS — K922 Gastrointestinal hemorrhage, unspecified: Secondary | ICD-10-CM

## 2024-05-18 DIAGNOSIS — I10 Essential (primary) hypertension: Secondary | ICD-10-CM

## 2024-05-18 DIAGNOSIS — F32A Depression, unspecified: Secondary | ICD-10-CM

## 2024-05-18 DIAGNOSIS — J189 Pneumonia, unspecified organism: Secondary | ICD-10-CM | POA: Diagnosis not present

## 2024-05-18 DIAGNOSIS — E785 Hyperlipidemia, unspecified: Secondary | ICD-10-CM

## 2024-05-18 DIAGNOSIS — N179 Acute kidney failure, unspecified: Secondary | ICD-10-CM | POA: Diagnosis not present

## 2024-05-18 DIAGNOSIS — E876 Hypokalemia: Secondary | ICD-10-CM | POA: Diagnosis not present

## 2024-05-18 LAB — BLOOD GAS, VENOUS
Acid-base deficit: 11.3 mmol/L — ABNORMAL HIGH (ref 0.0–2.0)
Bicarbonate: 11.6 mmol/L — ABNORMAL LOW (ref 20.0–28.0)
O2 Saturation: 85.8 %
Patient temperature: 37
pCO2, Ven: 20 mmHg — ABNORMAL LOW (ref 44–60)
pH, Ven: 7.37 (ref 7.25–7.43)
pO2, Ven: 50 mmHg — ABNORMAL HIGH (ref 32–45)

## 2024-05-18 LAB — CBC WITH DIFFERENTIAL/PLATELET
Abs Granulocyte: 4.5 K/uL (ref 1.5–6.5)
Abs Immature Granulocytes: 0.42 K/uL — ABNORMAL HIGH (ref 0.00–0.07)
Basophils Absolute: 0 K/uL (ref 0.0–0.1)
Basophils Relative: 0 %
Eosinophils Absolute: 0 K/uL (ref 0.0–0.5)
Eosinophils Relative: 0 %
HCT: 35.3 % — ABNORMAL LOW (ref 39.0–52.0)
Hemoglobin: 12.8 g/dL — ABNORMAL LOW (ref 13.0–17.0)
Immature Granulocytes: 8 %
Lymphocytes Relative: 7 %
Lymphs Abs: 0.4 K/uL — ABNORMAL LOW (ref 0.7–4.0)
MCH: 37.5 pg — ABNORMAL HIGH (ref 26.0–34.0)
MCHC: 36.3 g/dL — ABNORMAL HIGH (ref 30.0–36.0)
MCV: 103.5 fL — ABNORMAL HIGH (ref 80.0–100.0)
Monocytes Absolute: 0.2 K/uL (ref 0.1–1.0)
Monocytes Relative: 4 %
Neutro Abs: 4.5 K/uL (ref 1.7–7.7)
Neutrophils Relative %: 81 %
Platelets: 140 K/uL — ABNORMAL LOW (ref 150–400)
RBC: 3.41 MIL/uL — ABNORMAL LOW (ref 4.22–5.81)
RDW: 16.9 % — ABNORMAL HIGH (ref 11.5–15.5)
Smear Review: NORMAL
WBC: 5.5 K/uL (ref 4.0–10.5)
nRBC: 0.9 % — ABNORMAL HIGH (ref 0.0–0.2)

## 2024-05-18 LAB — COMPREHENSIVE METABOLIC PANEL WITH GFR
ALT: 363 U/L — ABNORMAL HIGH (ref 0–44)
ALT: 473 U/L — ABNORMAL HIGH (ref 0–44)
AST: 405 U/L — ABNORMAL HIGH (ref 15–41)
AST: 560 U/L — ABNORMAL HIGH (ref 15–41)
Albumin: 3.3 g/dL — ABNORMAL LOW (ref 3.5–5.0)
Albumin: 3.5 g/dL (ref 3.5–5.0)
Alkaline Phosphatase: 98 U/L (ref 38–126)
Alkaline Phosphatase: 109 U/L (ref 38–126)
Anion gap: 25 — ABNORMAL HIGH (ref 5–15)
Anion gap: 25 — ABNORMAL HIGH (ref 5–15)
BUN: 58 mg/dL — ABNORMAL HIGH (ref 8–23)
BUN: 59 mg/dL — ABNORMAL HIGH (ref 8–23)
CO2: 13 mmol/L — ABNORMAL LOW (ref 22–32)
CO2: 13 mmol/L — ABNORMAL LOW (ref 22–32)
Calcium: 8.5 mg/dL — ABNORMAL LOW (ref 8.9–10.3)
Calcium: 8.8 mg/dL — ABNORMAL LOW (ref 8.9–10.3)
Chloride: 97 mmol/L — ABNORMAL LOW (ref 98–111)
Chloride: 95 mmol/L — ABNORMAL LOW (ref 98–111)
Creatinine, Ser: 1.84 mg/dL — ABNORMAL HIGH (ref 0.61–1.24)
Creatinine, Ser: 2.27 mg/dL — ABNORMAL HIGH (ref 0.61–1.24)
GFR, Estimated: 38 mL/min — ABNORMAL LOW (ref >60)
GFR, Estimated: 29 mL/min — ABNORMAL LOW (ref >60)
Glucose, Bld: 130 mg/dL — ABNORMAL HIGH (ref 70–99)
Glucose, Bld: 124 mg/dL — ABNORMAL HIGH (ref 70–99)
Potassium: 2.2 mmol/L — CL (ref 3.5–5.1)
Potassium: <2 mmol/L — CL (ref 3.5–5.1)
Sodium: 135 mmol/L (ref 135–145)
Sodium: 133 mmol/L — ABNORMAL LOW (ref 135–145)
Total Bilirubin: 2 mg/dL — ABNORMAL HIGH (ref 0.0–1.2)
Total Bilirubin: 1.9 mg/dL — ABNORMAL HIGH (ref 0.0–1.2)
Total Protein: 5.7 g/dL — ABNORMAL LOW (ref 6.5–8.1)
Total Protein: 6.2 g/dL — ABNORMAL LOW (ref 6.5–8.1)

## 2024-05-18 LAB — POTASSIUM: Potassium: 2.9 mmol/L — ABNORMAL LOW (ref 3.5–5.1)

## 2024-05-18 LAB — PROTIME-INR
INR: 1.3 — ABNORMAL HIGH (ref 0.8–1.2)
Prothrombin Time: 16.5 s — ABNORMAL HIGH (ref 11.4–15.2)

## 2024-05-18 LAB — BASIC METABOLIC PANEL WITH GFR
Anion gap: 23 — ABNORMAL HIGH (ref 5–15)
BUN: 55 mg/dL — ABNORMAL HIGH (ref 8–23)
CO2: 13 mmol/L — ABNORMAL LOW (ref 22–32)
Calcium: 8.1 mg/dL — ABNORMAL LOW (ref 8.9–10.3)
Chloride: 98 mmol/L (ref 98–111)
Creatinine, Ser: 1.87 mg/dL — ABNORMAL HIGH (ref 0.61–1.24)
GFR, Estimated: 37 mL/min — ABNORMAL LOW (ref >60)
Glucose, Bld: 126 mg/dL — ABNORMAL HIGH (ref 70–99)
Potassium: 2.3 mmol/L — CL (ref 3.5–5.1)
Sodium: 134 mmol/L — ABNORMAL LOW (ref 135–145)

## 2024-05-18 LAB — C DIFFICILE QUICK SCREEN W PCR REFLEX
C Diff antigen: POSITIVE — AB
C Diff toxin: NEGATIVE

## 2024-05-18 LAB — CBC
HCT: 30.6 % — ABNORMAL LOW (ref 39.0–52.0)
Hemoglobin: 11.1 g/dL — ABNORMAL LOW (ref 13.0–17.0)
MCH: 37.4 pg — ABNORMAL HIGH (ref 26.0–34.0)
MCHC: 36.3 g/dL — ABNORMAL HIGH (ref 30.0–36.0)
MCV: 103 fL — ABNORMAL HIGH (ref 80.0–100.0)
Platelets: 103 K/uL — ABNORMAL LOW (ref 150–400)
RBC: 2.97 MIL/uL — ABNORMAL LOW (ref 4.22–5.81)
RDW: 16.7 % — ABNORMAL HIGH (ref 11.5–15.5)
WBC: 2.4 K/uL — ABNORMAL LOW (ref 4.0–10.5)
nRBC: 1.7 % — ABNORMAL HIGH (ref 0.0–0.2)

## 2024-05-18 LAB — CBG MONITORING, ED
Glucose-Capillary: 114 mg/dL — ABNORMAL HIGH (ref 70–99)
Glucose-Capillary: 116 mg/dL — ABNORMAL HIGH (ref 70–99)
Glucose-Capillary: 122 mg/dL — ABNORMAL HIGH (ref 70–99)
Glucose-Capillary: 127 mg/dL — ABNORMAL HIGH (ref 70–99)
Glucose-Capillary: 130 mg/dL — ABNORMAL HIGH (ref 70–99)

## 2024-05-18 LAB — GASTROINTESTINAL PANEL BY PCR, STOOL (REPLACES STOOL CULTURE)

## 2024-05-18 LAB — CLOSTRIDIUM DIFFICILE BY PCR, REFLEXED
Hypervirulent Strain: NEGATIVE
Toxigenic C. Difficile by PCR: NEGATIVE

## 2024-05-18 LAB — GLUCOSE, CAPILLARY
Glucose-Capillary: 105 mg/dL — ABNORMAL HIGH (ref 70–99)
Glucose-Capillary: 117 mg/dL — ABNORMAL HIGH (ref 70–99)
Glucose-Capillary: 247 mg/dL — ABNORMAL HIGH (ref 70–99)

## 2024-05-18 LAB — APTT: aPTT: 30 s (ref 24–36)

## 2024-05-18 LAB — MAGNESIUM: Magnesium: 2.8 mg/dL — ABNORMAL HIGH (ref 1.7–2.4)

## 2024-05-18 LAB — HEMOGLOBIN AND HEMATOCRIT, BLOOD
HCT: 29.4 % — ABNORMAL LOW (ref 39.0–52.0)
Hemoglobin: 10.8 g/dL — ABNORMAL LOW (ref 13.0–17.0)

## 2024-05-18 LAB — HEPATIC FUNCTION PANEL
ALT: 286 U/L — ABNORMAL HIGH (ref 0–44)
AST: 267 U/L — ABNORMAL HIGH (ref 15–41)
Albumin: 2.7 g/dL — ABNORMAL LOW (ref 3.5–5.0)
Alkaline Phosphatase: 78 U/L (ref 38–126)
Bilirubin, Direct: 0.2 mg/dL (ref 0.0–0.2)
Indirect Bilirubin: 1.9 mg/dL — ABNORMAL HIGH (ref 0.3–0.9)
Total Bilirubin: 2.1 mg/dL — ABNORMAL HIGH (ref 0.0–1.2)
Total Protein: 4.8 g/dL — ABNORMAL LOW (ref 6.5–8.1)

## 2024-05-18 LAB — LACTIC ACID, PLASMA: Lactic Acid, Venous: 1.1 mmol/L (ref 0.5–1.9)

## 2024-05-18 LAB — ETHANOL: Alcohol, Ethyl (B): <15 mg/dL (ref <15)

## 2024-05-18 LAB — RESP PANEL BY RT-PCR (RSV, FLU A&B, COVID)  RVPGX2
Influenza A by PCR: NEGATIVE
Influenza B by PCR: NEGATIVE
Resp Syncytial Virus by PCR: NEGATIVE
SARS Coronavirus 2 by RT PCR: NEGATIVE

## 2024-05-18 LAB — PHOSPHORUS: Phosphorus: 2.2 mg/dL — ABNORMAL LOW (ref 2.5–4.6)

## 2024-05-18 LAB — AMMONIA: Ammonia: 21 umol/L (ref 9–35)

## 2024-05-18 MED ORDER — INSULIN ASPART 100 UNIT/ML IJ SOLN
0.0000 [IU] | Freq: Three times a day (TID) | INTRAMUSCULAR | Status: DC
  Administered 2024-05-19: 3 [IU] via SUBCUTANEOUS
  Administered 2024-05-19: 1 [IU] via SUBCUTANEOUS
  Administered 2024-05-19: 2 [IU] via SUBCUTANEOUS
  Administered 2024-05-20 (×2): 1 [IU] via SUBCUTANEOUS
  Administered 2024-05-20 – 2024-05-21 (×5): 2 [IU] via SUBCUTANEOUS
  Administered 2024-05-21: 1 [IU] via SUBCUTANEOUS
  Administered 2024-05-21: 2 [IU] via SUBCUTANEOUS
  Administered 2024-05-21 – 2024-05-22 (×3): 1 [IU] via SUBCUTANEOUS
  Filled 2024-05-18 (×8): qty 1

## 2024-05-18 MED ORDER — SODIUM CHLORIDE 0.9 % IV SOLN: INTRAVENOUS | Status: AC

## 2024-05-18 MED ORDER — POTASSIUM CHLORIDE CRYS ER 20 MEQ PO TBCR
40.0000 meq | EXTENDED_RELEASE_TABLET | Freq: Once | ORAL | Status: AC
  Administered 2024-05-18: 40 meq via ORAL
  Filled 2024-05-18: qty 2

## 2024-05-18 MED ORDER — NYSTATIN 100000 UNIT/GM EX CREA
TOPICAL_CREAM | Freq: Two times a day (BID) | CUTANEOUS | Status: DC
  Filled 2024-05-18 (×2): qty 30

## 2024-05-18 MED ORDER — METOPROLOL TARTRATE 25 MG PO TABS
25.0000 mg | ORAL_TABLET | Freq: Once | ORAL | Status: AC
  Administered 2024-05-18: 25 mg via ORAL
  Filled 2024-05-18: qty 1

## 2024-05-18 MED ORDER — METOPROLOL TARTRATE 5 MG/5ML IV SOLN
5.0000 mg | Freq: Once | INTRAVENOUS | Status: AC
  Administered 2024-05-18: 5 mg via INTRAVENOUS
  Filled 2024-05-18: qty 5

## 2024-05-18 MED ORDER — METOPROLOL TARTRATE 25 MG PO TABS
25.0000 mg | ORAL_TABLET | Freq: Two times a day (BID) | ORAL | Status: DC
  Administered 2024-05-18 – 2024-05-22 (×13): 25 mg via ORAL
  Filled 2024-05-18 (×9): qty 1

## 2024-05-18 MED ORDER — INSULIN ASPART 100 UNIT/ML IJ SOLN
0.0000 [IU] | Freq: Every day | INTRAMUSCULAR | Status: DC
  Administered 2024-05-18: 2 [IU] via SUBCUTANEOUS
  Filled 2024-05-18: qty 1

## 2024-05-18 MED ORDER — LEVALBUTEROL HCL 0.63 MG/3ML IN NEBU: 0.6300 mg | INHALATION_SOLUTION | Freq: Four times a day (QID) | RESPIRATORY_TRACT | Status: DC | PRN

## 2024-05-18 MED ORDER — METOPROLOL TARTRATE 5 MG/5ML IV SOLN: 5.0000 mg | Freq: Once | INTRAVENOUS | Status: DC

## 2024-05-18 MED ORDER — METOPROLOL TARTRATE 25 MG PO TABS
12.5000 mg | ORAL_TABLET | Freq: Two times a day (BID) | ORAL | Status: DC
  Administered 2024-05-18: 12.5 mg via ORAL
  Filled 2024-05-18: qty 1

## 2024-05-18 MED ORDER — POTASSIUM CHLORIDE 10 MEQ/100ML IV SOLN
10.0000 meq | INTRAVENOUS | Status: AC
  Administered 2024-05-18 (×4): 10 meq via INTRAVENOUS
  Filled 2024-05-18 (×3): qty 100

## 2024-05-18 MED ORDER — DICYCLOMINE HCL 10 MG PO CAPS
10.0000 mg | ORAL_CAPSULE | Freq: Three times a day (TID) | ORAL | Status: AC
  Administered 2024-05-18 – 2024-05-20 (×7): 10 mg via ORAL
  Filled 2024-05-18 (×7): qty 1

## 2024-05-18 MED ORDER — ALBUMIN HUMAN 25 % IV SOLN
25.0000 g | Freq: Once | INTRAVENOUS | Status: AC
  Administered 2024-05-18: 25 g via INTRAVENOUS
  Filled 2024-05-18: qty 100

## 2024-05-18 MED ORDER — POTASSIUM CHLORIDE 10 MEQ/100ML IV SOLN
10.0000 meq | INTRAVENOUS | Status: AC
  Administered 2024-05-19 (×4): 10 meq via INTRAVENOUS
  Filled 2024-05-18 (×4): qty 100

## 2024-05-18 MED ORDER — SODIUM CHLORIDE 0.9 % IV BOLUS
1000.0000 mL | Freq: Once | INTRAVENOUS | Status: AC
  Administered 2024-05-18: 1000 mL via INTRAVENOUS

## 2024-05-18 MED ORDER — GERHARDT'S BUTT CREAM
TOPICAL_CREAM | Freq: Three times a day (TID) | CUTANEOUS | Status: DC
  Administered 2024-05-20 – 2024-05-21 (×8): 1 via TOPICAL
  Filled 2024-05-18 (×2): qty 60

## 2024-05-18 NOTE — Consult Note (Signed)
 PHARMACY CONSULT NOTE - ELECTROLYTES  Pharmacy Consult for Electrolyte Monitoring and Replacement   Recent Labs: Potassium (mmol/L)  Date Value  05/18/2024 2.9 (L)  05/01/2012 3.5   Magnesium  (mg/dL)  Date Value  91/90/7974 2.8 (H)  04/30/2012 1.8   Calcium  (mg/dL)  Date Value  91/90/7974 8.1 (L)   Calcium , Total (mg/dL)  Date Value  92/76/7986 8.9   Albumin  (g/dL)  Date Value  91/90/7974 2.7 (L)  04/30/2012 3.5   Phosphorus (mg/dL)  Date Value  91/90/7974 2.2 (L)   Sodium (mmol/L)  Date Value  05/18/2024 134 (L)  05/01/2012 142   Height: 5' 7 (170.2 cm) Weight: 78.9 kg (173 lb 15.1 oz) IBW/kg (Calculated) : 66.1 Estimated Creatinine Clearance: 31.9 mL/min (A) (by C-G formula based on SCr of 1.87 mg/dL (H)).  Assessment  David Christensen is a 73 y.o. male presenting with GIB, AKI, hypokalemia. PMH significant for GERD, depression, type 2 diabetes mellitus, hypertension and PTSD. Pharmacy has been consulted to monitor and replace electrolytes.  Diet: NPO MIVF: NS + 20 mEq K/L @ 125 mL/hr (60 mEq K+ for today) Pertinent medications: 160 mEq K+ ordered in addition to maintenance fluids for 8/9 (120 mEq PO and 40 mEq IV)  Goal of Therapy: Electrolytes within normal limits  Plan:  KCl 40 mEq PO times one Recheck all labs in AM  Thank you for allowing pharmacy to be a part of this patient's care.  Olam Fritter, PharmD, BCPS 05/18/2024 6:51 PM

## 2024-05-18 NOTE — Plan of Care (Signed)
 Pt arrived to unit from ED with afib RVR. Dr. Caleen notified. Pt given additional dose of PO metoprolol . No bowel movement so sample not sent to lab. Cream applied to buttocks/perineum. Wound consult completed and pictures taken of wounds.     Problem: Fluid Volume: Goal: Hemodynamic stability will improve Outcome: Progressing   Problem: Clinical Measurements: Goal: Diagnostic test results will improve Outcome: Progressing Goal: Signs and symptoms of infection will decrease Outcome: Progressing   Problem: Respiratory: Goal: Ability to maintain adequate ventilation will improve Outcome: Progressing   Problem: Education: Goal: Ability to describe self-care measures that may prevent or decrease complications (Diabetes Survival Skills Education) will improve Outcome: Progressing Goal: Individualized Educational Video(s) Outcome: Progressing   Problem: Coping: Goal: Ability to adjust to condition or change in health will improve Outcome: Progressing   Problem: Fluid Volume: Goal: Ability to maintain a balanced intake and output will improve Outcome: Progressing   Problem: Health Behavior/Discharge Planning: Goal: Ability to identify and utilize available resources and services will improve Outcome: Progressing Goal: Ability to manage health-related needs will improve Outcome: Progressing   Problem: Metabolic: Goal: Ability to maintain appropriate glucose levels will improve Outcome: Progressing   Problem: Nutritional: Goal: Maintenance of adequate nutrition will improve Outcome: Progressing Goal: Progress toward achieving an optimal weight will improve Outcome: Progressing   Problem: Skin Integrity: Goal: Risk for impaired skin integrity will decrease Outcome: Progressing   Problem: Tissue Perfusion: Goal: Adequacy of tissue perfusion will improve Outcome: Progressing   Problem: Education: Goal: Knowledge of General Education information will improve Description:  Including pain rating scale, medication(s)/side effects and non-pharmacologic comfort measures Outcome: Progressing   Problem: Health Behavior/Discharge Planning: Goal: Ability to manage health-related needs will improve Outcome: Progressing   Problem: Clinical Measurements: Goal: Ability to maintain clinical measurements within normal limits will improve Outcome: Progressing Goal: Will remain free from infection Outcome: Progressing Goal: Diagnostic test results will improve Outcome: Progressing Goal: Respiratory complications will improve Outcome: Progressing Goal: Cardiovascular complication will be avoided Outcome: Progressing   Problem: Activity: Goal: Risk for activity intolerance will decrease Outcome: Progressing   Problem: Nutrition: Goal: Adequate nutrition will be maintained Outcome: Progressing   Problem: Coping: Goal: Level of anxiety will decrease Outcome: Progressing   Problem: Elimination: Goal: Will not experience complications related to bowel motility Outcome: Progressing Goal: Will not experience complications related to urinary retention Outcome: Progressing   Problem: Pain Managment: Goal: General experience of comfort will improve and/or be controlled Outcome: Progressing   Problem: Safety: Goal: Ability to remain free from injury will improve Outcome: Progressing   Problem: Skin Integrity: Goal: Risk for impaired skin integrity will decrease Outcome: Progressing

## 2024-05-18 NOTE — Hospital Course (Addendum)
 Taken from H&P.  David Christensen is a 73 y.o. Caucasian male with medical history significant for GERD, depression, hypertension and PTSD, who presented to the emergency room with diarrhea over the last several weeks and recent melena with no bright red bleeding per rectum.  He has been having mild left lower quadrant lower abdominal pain and generalized weakness and fatigue.He has been having difficulty standing without assistance and had multiple falls over the past few days including today.SABRA   On presentation patient had mild tachycardia and tachypnea, labs with mild hyponatremia at 133, severe hypokalemia with potassium less than 2, CO2 13, BUN 59, creatinine 2.27, calcium  8.8 and anion gap of 27.  Elevated liver enzymes with AST 516, ALT 473, T. bili 1.9, lactic acid 2.1>> 1.7, leukocytosis 13.6, macrocytosis. Type and screen was done with blood group of [place] and negative antibody screen.  EKG with sinus tachycardia.  Imaging which include bilateral feet and elbow was negative for any acute fractures. CT abdomen and pelvis with concern of left lower lobe groundglass opacity/consolidation, suggestive of infection/inflammation.  Also noted second portion of duodenal diverticuli and chronic diverticulosis with no diverticulitis.  Patient was started on Rocephin  and Zithromax  along with IV Protonix . Electrolyte replacement ordered.  GI was consulted for concern of GI bleed.  8/9: Remained tachycardic, labs with mild leukopenia 2.4, hemoglobin 11.1, MCV 103 and platelets of 103.  Sodium 134, potassium 2.3, CO2 13, BUN 55 and creatinine with small improvement 1.87.  Improving transaminitis.  Preliminary blood cultures negative in 12-hour.  Replating more potassium. Ordered GI pathogen panel and COVID test.  8/10; vitals stable, labs with significant hypophosphatemia listed as less than 1, potassium 3.5, replating electrolyte, seems like developing pancytopenia with WBC 1.6, platelet 81 and hemoglobin  10.1-C. difficile antigen positive and negative toxin and PCR, GI pathogen panel negative, significant macrocytosis-anemia panel consistent with anemia of chronic disease, pending B12.  Persistent transaminitis-ordered hepatitis panel.  Blood cultures remain negative. Pending PT and OT evaluation.  8/11: Hemodynamically stable, diarrhea improved.  Persistent but slowly improving hypophosphatemia, borderline magnesium  and hypokalemia-electrolytes are being repleted.  B12 normal at 794.  Hepatitis panel negative.  PT is recommending home health. 8/12: Hemodynamically stable with persistent hypophosphatemia-likely refeeding syndrome.  Aggressively replating electrolyte.  8/13: Remained hemodynamically stable, continue to require 2 to 3 L of oxygen especially with ambulation.  Completed course of antibiotic for pneumonia.  Electrolytes were repleted and within normal limit.  Patient wants to go home, he is being discharged home with some home health.  Patient was instructed to avoid NSAID and was given tramadol  to use as needed for pain.  Patient will get benefit from outpatient gastroenterology evaluation as there was no active GI bleeding during hospitalization.  Patient also has pancytopenia with slight worsening of thrombocytopenia.  He was given referral for outpatient hematology for further evaluation.  Patient would Benefit from a repeat chest imaging in 4 to 6 weeks to see the resolution of current abnormalities.  He will continue on current medications and need to have a close follow-up with his providers for further assistance.

## 2024-05-18 NOTE — ED Notes (Addendum)
 Pt stating needs to be cleaned of BM. This RN cleaned pt of very small amount of BM. Pt buttocks very raw, barrier cream placed. Brief discarded.

## 2024-05-18 NOTE — Significant Event (Signed)
       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 Severe hypokalemia prohibits bicarb supplementationow - fluid resuscitaiton first Vbg stat Albumin  25 mg iv NS 500 ml bolus initailly then additional liter over 2 hours 5 IV metoprolol  ETOH level (seen 5/6 for ETOH intox) Additional 80 meq potassium ordered       Erminio LITTIE Cone NP [facility] Cross Cover 7pm-7am - check amion for availability Pager [phone]

## 2024-05-18 NOTE — Consult Note (Signed)
 [facility] GI Inpatient Consult Note   Ladell Francis Boss, M.D.  Reason for Consult: GI bleeding   Attending Requesting Consult: Madison Peaches, M.D.  Outpatient Primary Physician:   History of Present Illness: David Christensen is a 73 y.o. male with history of chronic pain syndrome, alpha gal allergy, hypertension, diabetes, lumbar spondylosis and chronic lower back pain presented to the emergency room last evening with complaint of bleeding.  Also noted were elevation of the liver associated enzyme, primarily transaminases.  He has been seen in the past for acute alcohol intoxication in the emergency room.  He was noted on today's presentation to have severe hypokalemia and possible sepsis. GI team has been asked to see the patient in regards to reported bleeding primarily.  As hemoglobin hematocrit have been stable, he is currently on observation while other medical issues are being managed. He mentioned left lower quadrant pain along with symptoms of intermittent melena without symptoms of bright red blood per rectum.  He stated to the the attending physician he had not had alcohol in 6 months. He told me it has been about 3 months.  Patient c/o diarrhea with going every 30 min up til presentation but says it has been several hours since his last BM. Last documented colonoscopy was 06/19/2014 revealing sigmoid diverticulosis, 2-3 subcm polyps and mild erythema of the sigmoid colon. Pathology not available. Patient remarks he has significant lower back pain and dizziness, saying, I feel like I am going to pass out all the time.  Past Medical History:  Past Medical History:  Diagnosis Date   Acid reflux    Cancer (HCC)    skin cancer   Depression    Diabetes mellitus without complication (HCC)    Hypertension    PTSD (post-traumatic stress disorder)     Problem List: Patient Active Problem List   Diagnosis Date Noted   GI bleeding 05/17/2024   Elevated LFTs 05/17/2024    AKI (acute kidney injury) (HCC) 05/17/2024   Sepsis due to pneumonia (HCC) 05/17/2024   Essential hypertension 05/17/2024   Hypokalemia 05/17/2024   Dyslipidemia 05/17/2024   Depression 05/17/2024   Cholecystitis 02/27/2015   Abdominal pain 02/26/2015    Past Surgical History: Past Surgical History:  Procedure Laterality Date   BACK SURGERY     CHOLECYSTECTOMY N/A 02/28/2015   Procedure: LAPAROSCOPIC CHOLECYSTECTOMY;  Surgeon: Lonni Brands, MD;  Location: [facility] ORS;  Service: General;  Laterality: N/A;   MELANOMA EXCISION     PACEMAKER INSERTION Left    PROSTATECTOMY     TONSILLECTOMY AND ADENOIDECTOMY      Allergies: Allergies  Allergen Reactions   Alpha-Gal Other (See Comments)    NO RED MEAT; sever stomach upset for up to 7 days   Metformin And Related Diarrhea   Penicillins Diarrhea   Zolpidem  Other (See Comments)    Other reaction(s): OTHER    Home Medications: (Not in a hospital admission)  Home medication reconciliation was completed with the patient.   Scheduled Inpatient Medications:    amLODipine   10 mg Oral Daily   dicyclomine   10 mg Oral TID AC   fluticasone   1 spray Each Nare Daily   gabapentin   100 mg Oral TID   guaiFENesin   600 mg Oral BID   insulin  aspart  0-9 Units Subcutaneous Q4H   ipratropium-albuterol   3 mL Nebulization QID   nystatin  cream   Topical BID   pantoprazole  (PROTONIX ) IV  40 mg Intravenous Q12H   potassium  chloride SA  20 mEq Oral Daily   potassium chloride   40 mEq Oral Once   QUEtiapine   100 mg Oral QHS    Continuous Inpatient Infusions:    0.9 % NaCl with KCl 20 mEq / L 125 mL/hr at 05/18/24 9147   azithromycin      cefTRIAXone  (ROCEPHIN )  IV     cefTRIAXone  (ROCEPHIN )  IV     potassium chloride  10 mEq (05/18/24 0956)    PRN Inpatient Medications:  acetaminophen  **OR** acetaminophen , chlorpheniramine-HYDROcodone, ipratropium-albuterol , ondansetron  **OR** ondansetron  (ZOFRAN ) IV, zolpidem   Family History: family  history includes Lung disease in his mother; Stroke in his father.   GI Family History: Negative  Social History:   reports that he has been smoking cigarettes. He has never used smokeless tobacco. He reports current alcohol use of about 2.0 standard drinks of alcohol per week. He reports that he does not use drugs. The patient denies ETOH, tobacco, or drug use.    Review of Systems: Review of Systems - Negative except HPI  Physical Examination: BP (!) 117/45   Pulse (!) 122   Temp 99 F (37.2 C) (Oral)   Resp (!) 22   SpO2 98%  Physical Exam Vitals reviewed.  Constitutional:      General: He is not in acute distress.    Appearance: He is obese. He is ill-appearing. He is not toxic-appearing or diaphoretic.  HENT:     Head: Normocephalic and atraumatic.     Mouth/Throat:     Pharynx: Oropharynx is clear.  Eyes:     Extraocular Movements: Extraocular movements intact.     Pupils: Pupils are equal, round, and reactive to light.  Cardiovascular:     Rate and Rhythm: Normal rate.  Pulmonary:     Effort: Pulmonary effort is normal. No respiratory distress.     Breath sounds: No wheezing or rhonchi.  Abdominal:     General: Bowel sounds are normal.     Palpations: Abdomen is soft. There is no fluid wave, hepatomegaly or splenomegaly.     Tenderness: There is no abdominal tenderness.     Hernia: No hernia is present. There is no hernia in the ventral area.  Skin:    General: Skin is warm and dry.     Coloration: Skin is not cyanotic.  Neurological:     General: No focal deficit present.     Mental Status: He is alert and oriented to person, place, and time.  Psychiatric:        Mood and Affect: Mood normal. Mood is not anxious.     Data: Lab Results  Component Value Date   WBC 2.4 (L) 05/18/2024   HGB 11.1 (L) 05/18/2024   HCT 30.6 (L) 05/18/2024   MCV 103.0 (H) 05/18/2024   PLT 103 (L) 05/18/2024   Recent Labs  Lab 05/17/24 1707 05/18/24 0007 05/18/24 0511   HGB 13.6 12.8* 11.1*   Lab Results  Component Value Date   NA 134 (L) 05/18/2024   K 2.3 (LL) 05/18/2024   CL 98 05/18/2024   CO2 13 (L) 05/18/2024   BUN 55 (H) 05/18/2024   CREATININE 1.87 (H) 05/18/2024   Lab Results  Component Value Date   ALT 286 (H) 05/18/2024   AST 267 (H) 05/18/2024   ALKPHOS 78 05/18/2024   BILITOT 2.1 (H) 05/18/2024   Recent Labs  Lab 05/18/24 0007  APTT 30  INR 1.3*      Latest Ref Rng & Units 05/18/2024  5:11 AM 05/18/2024   12:07 AM 05/17/2024    5:07 PM  CBC  WBC 4.0 - 10.5 K/uL 2.4  5.5  7.3   Hemoglobin 13.0 - 17.0 g/dL 88.8  87.1  86.3   Hematocrit 39.0 - 52.0 % 30.6  35.3  37.5   Platelets 150 - 400 K/uL 103  140  184     STUDIES: CT ABDOMEN PELVIS WO CONTRAST Result Date: 05/17/2024 CLINICAL DATA:  LLQ abdominal pain hemoccult positive diarrhea x 3 weeks EXAM: CT ABDOMEN AND PELVIS WITHOUT CONTRAST TECHNIQUE: Multidetector CT imaging of the abdomen and pelvis was performed following the standard protocol without IV contrast. RADIATION DOSE REDUCTION: This exam was performed according to the departmental dose-optimization program which includes automated exposure control, adjustment of the mA and/or kV according to patient size and/or use of iterative reconstruction technique. COMPARISON:  CT angio chest 02/13/2020 FINDINGS: Lower chest: Left lower lobe ground glass and consolidation airspace opacities. Right lower lobe bronchiole wall thickening. Cardiac leads. Hepatobiliary: No focal liver abnormality. Status post cholecystectomy. No biliary dilatation. Pancreas: No focal lesion. Normal pancreatic contour. No surrounding inflammatory changes. No main pancreatic ductal dilatation. Spleen: Normal in size without focal abnormality. Adrenals/Urinary Tract: Bilateral adrenal gland nodule hyperplasia. 1.1 cm right fluid density adrenal gland nodule likely representing an adenoma-no further follow-up indicated. Bilateral kidneys enhance symmetrically.  Fluid density and hemorrhagic/proteinaceous cysts- in the absence of clinically indicated signs/symptoms, require no independent follow-up. No hydronephrosis. No hydroureter.  No nephroureterolithiasis. The urinary bladder is unremarkable. Stomach/Bowel: Stomach is within normal limits. Second portion of the duodenum diverticula no evidence of bowel wall thickening or dilatation. Colonic diverticulosis. Appendix appears normal. Vascular/Lymphatic: No abdominal aorta or iliac aneurysm. Severe atherosclerotic plaque of the aorta and its branches. No abdominal, pelvic, or inguinal lymphadenopathy. Reproductive: No mass. Prostate not visualized and likely surgically removed. Other: No intraperitoneal free fluid. No intraperitoneal free gas. No organized fluid collection. Musculoskeletal: No abdominal wall hernia or abnormality. No suspicious lytic or blastic osseous lesions. No acute displaced fracture. L5-S1 degenerative changes. IMPRESSION: 1. Left lower lobe ground glass and consolidation airspace opacities. Findings suggestive of infection/inflammation. 2. Second portion of the duodenum diverticula and colonic diverticulosis with no acute diverticulitis. 3.  Aortic Atherosclerosis (ICD10-I70.0). 4. Limited evaluation on this noncontrast study. Electronically Signed   By: Morgane  Naveau M.D.   On: 05/17/2024 20:59   DG Elbow 2 Views Left Result Date: 05/17/2024 CLINICAL DATA:  fall w/ BL foot and elbow pain EXAM: LEFT ELBOW - 2 VIEW COMPARISON:  None Available. FINDINGS: Right elbow No acute fracture or dislocation. While the lateral radiograph is suboptimal due to patient positioning, no large elbow joint effusion is visualized. Soft tissues are unremarkable. Left elbow No acute fracture or dislocation. While the lateral radiograph is suboptimal due to patient positioning, no large elbow joint effusion is visualized. Soft tissues are unremarkable. IMPRESSION: Both lateral radiographs are suboptimal due to patient  positioning, which limits evaluation for a nondisplaced elbow fracture. No large elbow joint effusion, acute, displaced fracture, or dislocation in either elbow. If concern persists for elbow joint fracture, repeat imaging with improved positioning on the lateral radiographs would be recommended. Electronically Signed   By: Rogelia Myers M.D.   On: 05/17/2024 18:01   DG Elbow 2 Views Right Result Date: 05/17/2024 CLINICAL DATA:  fall w/ BL foot and elbow pain EXAM: RIGHT ELBOW - 2 VIEW COMPARISON:  None Available. FINDINGS: Right elbow No acute fracture or dislocation. While the lateral  radiograph is suboptimal due to patient positioning, no large elbow joint effusion is visualized. Soft tissues are unremarkable. Left elbow No acute fracture or dislocation. While the lateral radiograph is suboptimal due to patient positioning, no large elbow joint effusion is visualized. Soft tissues are unremarkable. IMPRESSION: Both lateral radiographs are suboptimal due to patient positioning, which limits evaluation for a nondisplaced elbow fracture. No large elbow joint effusion, acute, displaced fracture, or dislocation in either elbow. If concern persists for elbow joint fracture, repeat imaging with improved positioning on the lateral radiographs would be recommended. Electronically Signed   By: Rogelia Myers M.D.   On: 05/17/2024 18:00   DG Foot Complete Left Result Date: 05/17/2024 CLINICAL DATA:  fall w/ BL foot and elbow pain EXAM: LEFT FOOT - COMPLETE 3+ VIEW COMPARISON:  None Available. FINDINGS: No acute fracture or dislocation. Small undersurface calcaneal heel spur. Small Achilles insertion enthesophyte. Soft tissues are unremarkable. No radiopaque foreign body. IMPRESSION: No acute fracture or dislocation. Electronically Signed   By: Rogelia Myers M.D.   On: 05/17/2024 17:57   DG Foot Complete Right Result Date: 05/17/2024 CLINICAL DATA:  fall w/ BL foot and elbow pain EXAM: RIGHT FOOT COMPLETE - 3+  VIEW COMPARISON:  None Available. FINDINGS: No acute fracture or dislocation. There is no evidence of arthropathy or other focal bone abnormality. Soft tissues are unremarkable. No radiopaque foreign body. IMPRESSION: No acute fracture or dislocation. Electronically Signed   By: Rogelia Myers M.D.   On: 05/17/2024 17:56   @IMAGES @  Assessment:  Hypokalemia - Being managed by primary team. Possibly from diarrhea. Chronic pain syndrome. Therapeutic opioid dependence. AKI Melena Anemia presumably from GI blood loss. Consider UGI source primarily given melena but would benefit from lower luminal evaluation as well to rule out a right sided colonic lesion such as diverticula or colon neoplasm. Elevated aminotransferases - May be secondary to hypotensive episode from volume loss vs. Alcohol vs. Toxic exposure vs. Sepsis. Monitoring. Acute Kidney Injury - Likely from diarrhea/dehydration. Sepsis from Pneumonia - Per Medicine team. On IV antibiotics.     Recommendations:  IV acid suppression Correct electrolyte imbalance. Await resolution of AKI. Serial H/H, transfuse as needed. EGD +/- colonoscopy when clinically feasible. Will follow along.   Thank you for the consult. Please call with questions or concerns.  Aundria Ladell Eck MD [facility] [address] [place], [place] [address] [phone]  05/18/2024 10:31 AM

## 2024-05-18 NOTE — Progress Notes (Signed)
 Progress Note   Patient: David Christensen FMW:[id number] DOB: Jun 23, 1951 DOA: 05/17/2024     1 DOS: the patient was seen and examined on 05/18/2024   Brief hospital course: Taken from H&P.  David Christensen is a 73 y.o. Caucasian male with medical history significant for GERD, depression, hypertension and PTSD, who presented to the emergency room with diarrhea over the last several weeks and recent melena with no bright red bleeding per rectum.  He has been having mild left lower quadrant lower abdominal pain and generalized weakness and fatigue.He has been having difficulty standing without assistance and had multiple falls over the past few days including today.SABRA   On presentation patient had mild tachycardia and tachypnea, labs with mild hyponatremia at 133, severe hypokalemia with potassium less than 2, CO2 13, BUN 59, creatinine 2.27, calcium  8.8 and anion gap of 27.  Elevated liver enzymes with AST 516, ALT 473, T. bili 1.9, lactic acid 2.1>> 1.7, leukocytosis 13.6, macrocytosis. Type and screen was done with blood group of [place] and negative antibody screen.  EKG with sinus tachycardia.  Imaging which include bilateral feet and elbow was negative for any acute fractures. CT abdomen and pelvis with concern of left lower lobe groundglass opacity/consolidation, suggestive of infection/inflammation.  Also noted second portion of duodenal diverticuli and chronic diverticulosis with no diverticulitis.  Patient was started on Rocephin  and Zithromax  along with IV Protonix . Electrolyte replacement ordered.  GI was consulted for concern of GI bleed.  8/9: Remained tachycardic, labs with mild leukopenia 2.4, hemoglobin 11.1, MCV 103 and platelets of 103.  Sodium 134, potassium 2.3, CO2 13, BUN 55 and creatinine with small improvement 1.87.  Improving transaminitis.  Preliminary blood cultures negative in 12-hour.  Replating more potassium. Ordered GI pathogen panel and COVID test.  Assessment and Plan: * GI  bleeding Per patient he was having some melena, no NSAID use. Hemoglobin with some decreased to 11.1 this morning, it was 13.6 on admission, might be some dilutional effect. GI was consulted. - Monitor hemoglobin -Transfuse if below 7  Sepsis due to pneumonia (HCC) Sepsis is manifested by tachypnea and tachycardia. Chest imaging concerning for pneumonia, preliminary blood cultures negative. -Continue with ceftriaxone  and Zithromax  -Continue with supportive care  Hypokalemia Likely due to GI losses, severe hypokalemia, small improvement and now potassium is at 2.3.  Magnesium  normal. Mild hypophosphatemia -Replete electrolytes aggressively and monitor  AKI (acute kidney injury) (HCC) This is likely prerenal due to volume depletion and dehydration. Renal ultrasound was negative for any acute abnormality, did show bilateral simple renal cyst with no follow-up recommendations.  Small improvement of creatinine to 1.87 today.  Creatinine less than 1 at baseline -Continue with IV fluid -Monitor renal function -Avoid nephrotoxins  Elevated LFTs This could be related to significant volume depletion and hepatorenal syndrome.  Slowly improving with IV fluid -Monitor liver function -Holding statin  Essential hypertension Blood pressure within lower normal goal.  Patient was on losartan , HCTZ, amlodipine , metoprolol  and Minipress at home - Holding home antihypertensives -Starting low-dose metoprolol  -Monitor blood pressure closely  Depression Will continue Zoloft  at and Seroquel  as well as trazodone .  Dyslipidemia - Will hold off statin therapy given significantly elevated LFTs.   Subjective: Patient was feeling very weak and continued to have crampy abdominal pain.  Per patient he was having a lot of watery stools, intermittent melena.  Did not had any bowel movement since this morning.  Mild cough which is being going on for a month, no recent  change.  Physical Exam: Vitals:    05/18/24 0800 05/18/24 0830 05/18/24 0947 05/18/24 1258  BP: (!) 111/44 (!) 114/54 (!) 117/45 (!) 126/58  Pulse: (!) 125 (!) 122  (!) 160  Resp: 20 (!) 22    Temp:      TempSrc:      SpO2: 98% 98%     General.  Chronically ill-appearing elderly man, in no acute distress. Pulmonary.  Lungs clear bilaterally, normal respiratory effort. CV.  Sinus tachycardia. Abdomen.  Soft, mild diffuse tenderness, nondistended, BS positive. CNS.  Alert and oriented .  No focal neurologic deficit. Extremities.  No edema,  pulses intact and symmetrical. Psychiatry.  Judgment and insight appears normal.   Data Reviewed: Prior data reviewed.  Family Communication: Tried calling daughter with no response.  Disposition: Status is: Inpatient Remains inpatient appropriate because: Severity of illness  Planned Discharge Destination: Home with Home Health  Time spent: 50 minutes  This record has been created using Conservation officer, historic buildings. Errors have been sought and corrected,but may not always be located. Such creation errors do not reflect on the standard of care.   Author: Amaryllis Dare, MD 05/18/2024 1:17 PM  For on call review [email or web address].

## 2024-05-18 NOTE — Consult Note (Signed)
 WOC Nurse Consult Note: Reason for Consult: rectal irritation  Wound type: 1.  Moisture associated skin damage to buttocks/rectum  ICD-10 CM Codes for Irritant Dermatitis L24A2 - Due to fecal, urinary or dual incontinence 2.  Full thickness R arm and hand likely r/t trauma (multiple falls at home per MD note)    Pressure Injury POA: NA  Measurement: widespread to rectum/buttocks; scattered on R hand and arm see nursing flowsheet  Wound bed: erythema with scattered partial thickness skin loss to inner buttocks and around rectum; R hand and arm with scattered areas of dry hemorrhagic tissue some red moist appearing to arm  Drainage (amount, consistency, odor) see nursing flowsheet  Periwound: Dressing procedure/placement/frequency:  Cleanse buttocks/rectal area with Vashe wound cleanser Soila [phone]) do not rinse and allow to air dry. Apply Gerhardt's Butt Cream to area 3 times a day and prn soiling.  May sprinkle over Gerhardt's with floor stock antifungal powder (microguard green and white label) for extra drying effect.  Cleanse hand and arm wounds with Vashe, do not rinse.  Apply Xeroform gauze ([place] [phone]) to wound beds every other day and secure with silicone foam or Kerlix roll gauze whichever is preferred.  POC discussed with bedside nurse. WOC team will not follow. Re-consult if further needs arise.   Thank you,    Powell Bar MSN, RN-BC, [employer]

## 2024-05-18 NOTE — Consult Note (Signed)
 PHARMACY CONSULT NOTE - ELECTROLYTES  Pharmacy Consult for Electrolyte Monitoring and Replacement   Recent Labs: Potassium (mmol/L)  Date Value  05/18/2024 2.3 (LL)  05/01/2012 3.5   Magnesium  (mg/dL)  Date Value  91/90/7974 2.8 (H)  04/30/2012 1.8   Calcium  (mg/dL)  Date Value  91/90/7974 8.1 (L)   Calcium , Total (mg/dL)  Date Value  92/76/7986 8.9   Albumin  (g/dL)  Date Value  91/90/7974 3.3 (L)  04/30/2012 3.5   Sodium (mmol/L)  Date Value  05/18/2024 134 (L)  05/01/2012 142     Estimated Creatinine Clearance: 31.9 mL/min (A) (by C-G formula based on SCr of 1.87 mg/dL (H)).  Assessment  David Christensen is a 73 y.o. male presenting with GIB, AKI, hypokalemia. PMH significant for GERD, depression, type 2 diabetes mellitus, hypertension and PTSD. Pharmacy has been consulted to monitor and replace electrolytes.  Diet: NPO MIVF: NS + 20 mEq K/L @ 125 mL/hr (60 mEq K+ for today) Pertinent medications: 160 mEq K+ ordered in addition to maintenance fluids for 8/9 (120 mEq PO and 40 mEq IV)  Goal of Therapy: Electrolytes within normal limits  Plan:  No additional replacement. Re-check K+ at 1800 today and all labs again tomorrow AM  Thank you for allowing pharmacy to be a part of this patient's care.  [name] [name] 05/18/2024 8:45 AM

## 2024-05-19 DIAGNOSIS — D61818 Other pancytopenia: Secondary | ICD-10-CM

## 2024-05-19 DIAGNOSIS — K922 Gastrointestinal hemorrhage, unspecified: Secondary | ICD-10-CM | POA: Diagnosis not present

## 2024-05-19 DIAGNOSIS — J189 Pneumonia, unspecified organism: Secondary | ICD-10-CM | POA: Diagnosis not present

## 2024-05-19 DIAGNOSIS — E876 Hypokalemia: Secondary | ICD-10-CM | POA: Diagnosis not present

## 2024-05-19 DIAGNOSIS — N179 Acute kidney failure, unspecified: Secondary | ICD-10-CM | POA: Diagnosis not present

## 2024-05-19 LAB — CBC
HCT: 27.9 % — ABNORMAL LOW (ref 39.0–52.0)
Hemoglobin: 10.1 g/dL — ABNORMAL LOW (ref 13.0–17.0)
MCH: 37.7 pg — ABNORMAL HIGH (ref 26.0–34.0)
MCHC: 36.2 g/dL — ABNORMAL HIGH (ref 30.0–36.0)
MCV: 104.1 fL — ABNORMAL HIGH (ref 80.0–100.0)
Platelets: 81 K/uL — ABNORMAL LOW (ref 150–400)
RBC: 2.68 MIL/uL — ABNORMAL LOW (ref 4.22–5.81)
RDW: 17 % — ABNORMAL HIGH (ref 11.5–15.5)
WBC: 1.6 K/uL — ABNORMAL LOW (ref 4.0–10.5)
nRBC: 8 % — ABNORMAL HIGH (ref 0.0–0.2)

## 2024-05-19 LAB — POTASSIUM: Potassium: 3.3 mmol/L — ABNORMAL LOW (ref 3.5–5.1)

## 2024-05-19 LAB — VITAMIN B12: Vitamin B-12: 794 pg/mL (ref 180–914)

## 2024-05-19 LAB — COMPREHENSIVE METABOLIC PANEL WITH GFR
ALT: 184 U/L — ABNORMAL HIGH (ref 0–44)
ALT: 195 U/L — ABNORMAL HIGH (ref 0–44)
AST: 107 U/L — ABNORMAL HIGH (ref 15–41)
AST: 124 U/L — ABNORMAL HIGH (ref 15–41)
Albumin: 2.9 g/dL — ABNORMAL LOW (ref 3.5–5.0)
Albumin: 2.9 g/dL — ABNORMAL LOW (ref 3.5–5.0)
Alkaline Phosphatase: 88 U/L (ref 38–126)
Alkaline Phosphatase: 89 U/L (ref 38–126)
Anion gap: 14 (ref 5–15)
Anion gap: 14 (ref 5–15)
BUN: 20 mg/dL (ref 8–23)
BUN: 25 mg/dL — ABNORMAL HIGH (ref 8–23)
CO2: 14 mmol/L — ABNORMAL LOW (ref 22–32)
CO2: 15 mmol/L — ABNORMAL LOW (ref 22–32)
Calcium: 7.5 mg/dL — ABNORMAL LOW (ref 8.9–10.3)
Calcium: 7.5 mg/dL — ABNORMAL LOW (ref 8.9–10.3)
Chloride: 107 mmol/L (ref 98–111)
Chloride: 108 mmol/L (ref 98–111)
Creatinine, Ser: 0.79 mg/dL (ref 0.61–1.24)
Creatinine, Ser: 1.21 mg/dL (ref 0.61–1.24)
GFR, Estimated: >60 mL/min (ref >60)
GFR, Estimated: >60 mL/min (ref >60)
Glucose, Bld: 134 mg/dL — ABNORMAL HIGH (ref 70–99)
Glucose, Bld: 176 mg/dL — ABNORMAL HIGH (ref 70–99)
Potassium: 3 mmol/L — ABNORMAL LOW (ref 3.5–5.1)
Potassium: 3.5 mmol/L (ref 3.5–5.1)
Sodium: 135 mmol/L (ref 135–145)
Sodium: 137 mmol/L (ref 135–145)
Total Bilirubin: 1.6 mg/dL — ABNORMAL HIGH (ref 0.0–1.2)
Total Bilirubin: 1.8 mg/dL — ABNORMAL HIGH (ref 0.0–1.2)
Total Protein: 5.3 g/dL — ABNORMAL LOW (ref 6.5–8.1)
Total Protein: 5.4 g/dL — ABNORMAL LOW (ref 6.5–8.1)

## 2024-05-19 LAB — BLOOD GAS, VENOUS
Acid-base deficit: 7.2 mmol/L — ABNORMAL HIGH (ref 0.0–2.0)
Bicarbonate: 14.6 mmol/L — ABNORMAL LOW (ref 20.0–28.0)
O2 Saturation: 94.1 %
Patient temperature: 37
pCO2, Ven: 21 mmHg — ABNORMAL LOW (ref 44–60)
pH, Ven: 7.45 — ABNORMAL HIGH (ref 7.25–7.43)
pO2, Ven: 55 mmHg — ABNORMAL HIGH (ref 32–45)

## 2024-05-19 LAB — GLUCOSE, CAPILLARY
Glucose-Capillary: 137 mg/dL — ABNORMAL HIGH (ref 70–99)
Glucose-Capillary: 140 mg/dL — ABNORMAL HIGH (ref 70–99)
Glucose-Capillary: 147 mg/dL — ABNORMAL HIGH (ref 70–99)
Glucose-Capillary: 162 mg/dL — ABNORMAL HIGH (ref 70–99)
Glucose-Capillary: 171 mg/dL — ABNORMAL HIGH (ref 70–99)
Glucose-Capillary: 177 mg/dL — ABNORMAL HIGH (ref 70–99)

## 2024-05-19 LAB — PHOSPHORUS
Phosphorus: <1 mg/dL — CL (ref 2.5–4.6)
Phosphorus: <1 mg/dL — CL (ref 2.5–4.6)

## 2024-05-19 LAB — RETICULOCYTES
Immature Retic Fract: 39.7 % — ABNORMAL HIGH (ref 2.3–15.9)
RBC.: 2.73 MIL/uL — ABNORMAL LOW (ref 4.22–5.81)
Retic Count, Absolute: 53.2 K/uL (ref 19.0–186.0)
Retic Ct Pct: 2 % (ref 0.4–3.1)

## 2024-05-19 LAB — FOLATE: Folate: 14.6 ng/mL (ref >5.9)

## 2024-05-19 LAB — IRON AND TIBC
Iron: 96 ug/dL (ref 45–182)
Saturation Ratios: 44 % — ABNORMAL HIGH (ref 17.9–39.5)
TIBC: 220 ug/dL — ABNORMAL LOW (ref 250–450)
UIBC: 124 ug/dL

## 2024-05-19 LAB — MAGNESIUM: Magnesium: 2.3 mg/dL (ref 1.7–2.4)

## 2024-05-19 LAB — FERRITIN: Ferritin: 605 ng/mL — ABNORMAL HIGH (ref 24–336)

## 2024-05-19 MED ORDER — TRAMADOL HCL 50 MG PO TABS
50.0000 mg | ORAL_TABLET | Freq: Four times a day (QID) | ORAL | Status: DC | PRN
  Administered 2024-05-19 – 2024-05-22 (×8): 50 mg via ORAL
  Filled 2024-05-19 (×5): qty 1

## 2024-05-19 MED ORDER — SODIUM PHOSPHATES 45 MMOLE/15ML IV SOLN
30.0000 mmol | Freq: Once | INTRAVENOUS | Status: AC
  Administered 2024-05-19: 30 mmol via INTRAVENOUS
  Filled 2024-05-19: qty 10

## 2024-05-19 MED ORDER — DIAZEPAM 5 MG/ML IJ SOLN
2.5000 mg | Freq: Once | INTRAMUSCULAR | Status: AC
  Administered 2024-05-19: 2.5 mg via INTRAVENOUS
  Filled 2024-05-19: qty 2

## 2024-05-19 MED ORDER — POTASSIUM PHOSPHATES 15 MMOLE/5ML IV SOLN
30.0000 mmol | Freq: Once | INTRAVENOUS | Status: AC
  Administered 2024-05-19: 30 mmol via INTRAVENOUS
  Filled 2024-05-19: qty 10

## 2024-05-19 MED ORDER — IPRATROPIUM-ALBUTEROL 0.5-2.5 (3) MG/3ML IN SOLN
3.0000 mL | Freq: Two times a day (BID) | RESPIRATORY_TRACT | Status: DC
  Administered 2024-05-19 – 2024-05-21 (×7): 3 mL via RESPIRATORY_TRACT
  Filled 2024-05-19 (×4): qty 3

## 2024-05-19 MED ORDER — MORPHINE SULFATE (PF) 2 MG/ML IV SOLN
2.0000 mg | Freq: Once | INTRAVENOUS | Status: AC
  Administered 2024-05-19: 2 mg via INTRAVENOUS
  Filled 2024-05-19: qty 1

## 2024-05-19 MED ORDER — POTASSIUM CHLORIDE CRYS ER 20 MEQ PO TBCR
40.0000 meq | EXTENDED_RELEASE_TABLET | Freq: Once | ORAL | Status: AC
  Administered 2024-05-19: 40 meq via ORAL
  Filled 2024-05-19: qty 2

## 2024-05-19 NOTE — Assessment & Plan Note (Signed)
 Sepsis is manifested by tachypnea and tachycardia. Chest imaging concerning for pneumonia, preliminary blood cultures negative. -Continue with ceftriaxone  and Zithromax  -Continue with supportive care

## 2024-05-19 NOTE — Assessment & Plan Note (Signed)
 Per patient he was having some melena, no NSAID use. Hemoglobin with some decreased to 10.1 this morning, it was 13.6 on admission,  GI was consulted-patient likely need EGD and colonoscopy and they would like to correct electrolyte abnormalities before proceeding. Anemia panel consistent with anemia of chronic disease, B12 pending - Monitor hemoglobin -Transfuse if below 7

## 2024-05-19 NOTE — Progress Notes (Signed)
 IV therapy received a consult around 6am for a 2nd iv site;  this writer was asked by the night shift IV Nurse to do another assessment; Pt has a large infiltrated area on his left forearm which extends up his arm, slightly above the [facility] area;  he currently has an 18ga near the [facility] for IV fluids; both arms assessed; multiple bruises and skin tears noted; he is on 2 antibiotics, K Phos , and multiple other iv meds; suggest PICC line for this pt for better access.  RN aware of recommendation for a PICC.

## 2024-05-19 NOTE — Progress Notes (Signed)
 Date and time results received: 05/19/24 1840 (use smartphrase .now to insert current time)  Test: phosphorus Critical Value: less than 1  Name of Provider Notified: Caleen, MD  Orders Received? Or Actions Taken?: Orders Received - See Orders for details

## 2024-05-19 NOTE — Plan of Care (Signed)
  Problem: Fluid Volume: Goal: Hemodynamic stability will improve Outcome: Progressing   Problem: Respiratory: Goal: Ability to maintain adequate ventilation will improve Outcome: Progressing   Problem: Coping: Goal: Ability to adjust to condition or change in health will improve Outcome: Progressing   Problem: Fluid Volume: Goal: Ability to maintain a balanced intake and output will improve Outcome: Progressing

## 2024-05-19 NOTE — Progress Notes (Signed)
 [facility] Inpatient Progress Note    Subjective: Patient seen for follow up of melena, diarrhea, elevated liver enzymes. Liver enzymes improving . Diarrhea improving. No melena today. Stools more brown in color. Patient still SOB and on supp O2. Has critically low Phos, per attending MD.  Objective: Vital signs in last 24 hours: Temp:  [98.2 F (36.8 C)-99.3 F (37.4 C)] 98.8 F (37.1 C) (08/10 1132) Pulse Rate:  [73-149] 74 (08/10 1132) Resp:  [17-33] 17 (08/10 0417) BP: (106-140)/(51-77) 120/77 (08/10 1132) SpO2:  [90 %-96 %] 90 % (08/10 1132) Weight:  [78.9 kg] 78.9 kg (08/09 1529) Blood pressure 120/77, pulse 74, temperature 98.8 F (37.1 C), resp. rate 17, height 5' 7 (1.702 m), weight 78.9 kg, SpO2 90%.    Intake/Output from previous day: 08/09 0701 - 08/10 0700 In: 3891.1 [I.V.:2579.7; IV Piggyback:1311.5] Out: 925 [Urine:925]  Intake/Output this shift: Total I/O In: -  Out: 600 [Urine:600]   Gen: NAD. Appears mildly dyspneic.  HEENT: [place]/AT. PERRLA. Normal external ear exam.  Chest: CTA, no wheezes. Decr breath sounds.  CV: RR nl S1, S2. No gallops.  Abd: soft, obese, mildly tender in lower quadrants bilaterally, nd. BS+  Ext: no edema. Pulses 2+  Neuro: Alert and oriented. Judgement appears normal. Nonfocal.   Lab Results: Results for orders placed or performed during the hospital encounter of 05/17/24 (from the past 24 hours)  Glucose, capillary     Status: Abnormal   Collection Time: 05/18/24  4:25 PM  Result Value Ref Range   Glucose-Capillary 117 (H) 70 - 99 mg/dL  Gastrointestinal Panel by PCR , Stool     Status: None   Collection Time: 05/18/24  4:59 PM   Specimen: Stool  Result Value Ref Range   Campylobacter species NOT DETECTED NOT DETECTED   Plesimonas shigelloides NOT DETECTED NOT DETECTED   Salmonella species NOT DETECTED NOT DETECTED   Yersinia enterocolitica NOT DETECTED NOT DETECTED   Vibrio species NOT  DETECTED NOT DETECTED   Vibrio cholerae NOT DETECTED NOT DETECTED   Enteroaggregative E coli (EAEC) NOT DETECTED NOT DETECTED   Enteropathogenic E coli (EPEC) NOT DETECTED NOT DETECTED   Enterotoxigenic E coli (ETEC) NOT DETECTED NOT DETECTED   Shiga like toxin producing E coli (STEC) NOT DETECTED NOT DETECTED   Shigella/Enteroinvasive E coli (EIEC) NOT DETECTED NOT DETECTED   Cryptosporidium NOT DETECTED NOT DETECTED   Cyclospora cayetanensis NOT DETECTED NOT DETECTED   Entamoeba histolytica NOT DETECTED NOT DETECTED   Giardia lamblia NOT DETECTED NOT DETECTED   Adenovirus F40/41 NOT DETECTED NOT DETECTED   Astrovirus NOT DETECTED NOT DETECTED   Norovirus GI/GII NOT DETECTED NOT DETECTED   Rotavirus A NOT DETECTED NOT DETECTED   Sapovirus (I, II, IV, and V) NOT DETECTED NOT DETECTED  C Difficile Quick Screen w PCR reflex     Status: Abnormal   Collection Time: 05/18/24  4:59 PM   Specimen: STOOL  Result Value Ref Range   C Diff antigen POSITIVE (A) NEGATIVE   C Diff toxin NEGATIVE NEGATIVE   C Diff interpretation Results are indeterminate. See PCR results.   C. Diff by PCR, Reflexed     Status: None   Collection Time: 05/18/24  4:59 PM  Result Value Ref Range   Toxigenic C. Difficile by PCR NEGATIVE NEGATIVE   Hypervirulent Strain PRESUMPTIVE NEGATIVE PRESUMPTIVE NEGATIVE  Potassium     Status: Abnormal   Collection Time: 05/18/24  5:54 PM  Result Value Ref Range  Potassium 2.9 (L) 3.5 - 5.1 mmol/L  Glucose, capillary     Status: Abnormal   Collection Time: 05/18/24  9:14 PM  Result Value Ref Range   Glucose-Capillary 247 (H) 70 - 99 mg/dL  Glucose, capillary     Status: Abnormal   Collection Time: 05/19/24 12:00 AM  Result Value Ref Range   Glucose-Capillary 171 (H) 70 - 99 mg/dL  Comprehensive metabolic panel     Status: Abnormal   Collection Time: 05/19/24 12:02 AM  Result Value Ref Range   Sodium 135 135 - 145 mmol/L   Potassium 3.0 (L) 3.5 - 5.1 mmol/L   Chloride  107 98 - 111 mmol/L   CO2 14 (L) 22 - 32 mmol/L   Glucose, Bld 176 (H) 70 - 99 mg/dL   BUN 25 (H) 8 - 23 mg/dL   Creatinine, Ser 8.78 0.61 - 1.24 mg/dL   Calcium  7.5 (L) 8.9 - 10.3 mg/dL   Total Protein 5.3 (L) 6.5 - 8.1 g/dL   Albumin  2.9 (L) 3.5 - 5.0 g/dL   AST 875 (H) 15 - 41 U/L   ALT 195 (H) 0 - 44 U/L   Alkaline Phosphatase 89 38 - 126 U/L   Total Bilirubin 1.8 (H) 0.0 - 1.2 mg/dL   GFR, Estimated >39 >39 mL/min   Anion gap 14 5 - 15  Blood gas, venous     Status: Abnormal   Collection Time: 05/19/24 12:03 AM  Result Value Ref Range   pH, Ven 7.45 (H) 7.25 - 7.43   pCO2, Ven 21 (L) 44 - 60 mmHg   pO2, Ven 55 (H) 32 - 45 mmHg   Bicarbonate 14.6 (L) 20.0 - 28.0 mmol/L   Acid-base deficit 7.2 (H) 0.0 - 2.0 mmol/L   O2 Saturation 94.1 %   Patient temperature 37.0    Collection site VEIN   Glucose, capillary     Status: Abnormal   Collection Time: 05/19/24  4:14 AM  Result Value Ref Range   Glucose-Capillary 137 (H) 70 - 99 mg/dL  Folate     Status: None   Collection Time: 05/19/24  4:52 AM  Result Value Ref Range   Folate 14.6 >5.9 ng/mL  Iron and TIBC     Status: Abnormal   Collection Time: 05/19/24  4:52 AM  Result Value Ref Range   Iron 96 45 - 182 ug/dL   TIBC 779 (L) 749 - 549 ug/dL   Saturation Ratios 44 (H) 17.9 - 39.5 %   UIBC 124 ug/dL  Ferritin     Status: Abnormal   Collection Time: 05/19/24  4:52 AM  Result Value Ref Range   Ferritin 605 (H) 24 - 336 ng/mL  Reticulocytes     Status: Abnormal   Collection Time: 05/19/24  4:52 AM  Result Value Ref Range   Retic Ct Pct 2.0 0.4 - 3.1 %   RBC. 2.73 (L) 4.22 - 5.81 MIL/uL   Retic Count, Absolute 53.2 19.0 - 186.0 K/uL   Immature Retic Fract 39.7 (H) 2.3 - 15.9 %  Phosphorus     Status: Abnormal   Collection Time: 05/19/24  4:53 AM  Result Value Ref Range   Phosphorus <1.0 (LL) 2.5 - 4.6 mg/dL  Magnesium      Status: None   Collection Time: 05/19/24  4:53 AM  Result Value Ref Range   Magnesium  2.3 1.7  - 2.4 mg/dL  CBC     Status: Abnormal   Collection Time: 05/19/24  4:53  AM  Result Value Ref Range   WBC 1.6 (L) 4.0 - 10.5 K/uL   RBC 2.68 (L) 4.22 - 5.81 MIL/uL   Hemoglobin 10.1 (L) 13.0 - 17.0 g/dL   HCT 72.0 (L) 60.9 - 47.9 %   MCV 104.1 (H) 80.0 - 100.0 fL   MCH 37.7 (H) 26.0 - 34.0 pg   MCHC 36.2 (H) 30.0 - 36.0 g/dL   RDW 82.9 (H) 88.4 - 84.4 %   Platelets 81 (L) 150 - 400 K/uL   nRBC 8.0 (H) 0.0 - 0.2 %  Comprehensive metabolic panel with GFR     Status: Abnormal   Collection Time: 05/19/24  4:53 AM  Result Value Ref Range   Sodium 137 135 - 145 mmol/L   Potassium 3.5 3.5 - 5.1 mmol/L   Chloride 108 98 - 111 mmol/L   CO2 15 (L) 22 - 32 mmol/L   Glucose, Bld 134 (H) 70 - 99 mg/dL   BUN 20 8 - 23 mg/dL   Creatinine, Ser 9.20 0.61 - 1.24 mg/dL   Calcium  7.5 (L) 8.9 - 10.3 mg/dL   Total Protein 5.4 (L) 6.5 - 8.1 g/dL   Albumin  2.9 (L) 3.5 - 5.0 g/dL   AST 892 (H) 15 - 41 U/L   ALT 184 (H) 0 - 44 U/L   Alkaline Phosphatase 88 38 - 126 U/L   Total Bilirubin 1.6 (H) 0.0 - 1.2 mg/dL   GFR, Estimated >39 >39 mL/min   Anion gap 14 5 - 15  Glucose, capillary     Status: Abnormal   Collection Time: 05/19/24 11:34 AM  Result Value Ref Range   Glucose-Capillary 177 (H) 70 - 99 mg/dL     Recent Labs    91/90/74 0007 05/18/24 0511 05/18/24 1300 05/19/24 0453  WBC 5.5 2.4*  --  1.6*  HGB 12.8* 11.1* 10.8* 10.1*  HCT 35.3* 30.6* 29.4* 27.9*  PLT 140* 103*  --  81*   BMET Recent Labs    05/18/24 0511 05/18/24 1754 05/19/24 0002 05/19/24 0453  NA 134*  --  135 137  K 2.3* 2.9* 3.0* 3.5  CL 98  --  107 108  CO2 13*  --  14* 15*  GLUCOSE 126*  --  176* 134*  BUN 55*  --  25* 20  CREATININE 1.87*  --  1.21 0.79  CALCIUM  8.1*  --  7.5* 7.5*   LFT Recent Labs    05/18/24 0508 05/19/24 0002 05/19/24 0453  PROT 4.8*   < > 5.4*  ALBUMIN  2.7*   < > 2.9*  AST 267*   < > 107*  ALT 286*   < > 184*  ALKPHOS 78   < > 88  BILITOT 2.1*   < > 1.6*  BILIDIR 0.2  --    --   IBILI 1.9*  --   --    < > = values in this interval not displayed.   PT/INR Recent Labs    05/18/24 0007  LABPROT 16.5*  INR 1.3*   Hepatitis Panel No results for input(s): HEPBSAG, HCVAB, HEPAIGM, HEPBIGM in the last 72 hours. C-Diff Recent Labs    05/18/24 1659  CDIFFTOX NEGATIVE   Recent Labs    05/18/24 1659  CDIFFPCR NEGATIVE     Studies/Results: US  RENAL Result Date: 05/18/2024 EXAM: RETROPERITONEAL ULTRASOUND OF THE KIDNEYS AND URINARY BLADDER TECHNIQUE: Real-time ultrasonography of the retroperitoneum including the kidneys and urinary bladder was performed. COMPARISON: None CLINICAL HISTORY: [id number]  AKI (acute kidney injury) (HCC) [id number]. AKI (acute kidney injury) (HCC) FINDINGS: RIGHT KIDNEY: The right kidney measures 10.6 x 5.9 x 5.7 cm. A 10 mm simple cyst is present at the lower pole of the right kidney. Right kidney volume is estimated at 186.5 ml. The right kidney demonstrates normal cortical echogenicity. No hydronephrosis or intrarenal stones. LEFT KIDNEY: The left kidney measures 10.4 x 6.5 x 5.8 cm. Estimated volume is 203.1 ml. A simple cyst at the lower pole of the left kidney measures 2.0 cm maximally. The left kidney demonstrates normal cortical echogenicity. No hydronephrosis or intrarenal stones. BLADDER: Unremarkable appearance of the bladder. IMPRESSION: 1. No acute findings. 2. Bilateral simple renal cysts. Recommend no imaging follow up for these studies. Electronically signed by: Lonni Necessary MD 05/18/2024 11:22 AM EDT RP Workstation: [id number]   CT ABDOMEN PELVIS WO CONTRAST Result Date: 05/17/2024 CLINICAL DATA:  LLQ abdominal pain hemoccult positive diarrhea x 3 weeks EXAM: CT ABDOMEN AND PELVIS WITHOUT CONTRAST TECHNIQUE: Multidetector CT imaging of the abdomen and pelvis was performed following the standard protocol without IV contrast. RADIATION DOSE REDUCTION: This exam was performed according to the departmental dose-optimization  program which includes automated exposure control, adjustment of the mA and/or kV according to patient size and/or use of iterative reconstruction technique. COMPARISON:  CT angio chest 02/13/2020 FINDINGS: Lower chest: Left lower lobe ground glass and consolidation airspace opacities. Right lower lobe bronchiole wall thickening. Cardiac leads. Hepatobiliary: No focal liver abnormality. Status post cholecystectomy. No biliary dilatation. Pancreas: No focal lesion. Normal pancreatic contour. No surrounding inflammatory changes. No main pancreatic ductal dilatation. Spleen: Normal in size without focal abnormality. Adrenals/Urinary Tract: Bilateral adrenal gland nodule hyperplasia. 1.1 cm right fluid density adrenal gland nodule likely representing an adenoma-no further follow-up indicated. Bilateral kidneys enhance symmetrically. Fluid density and hemorrhagic/proteinaceous cysts- in the absence of clinically indicated signs/symptoms, require no independent follow-up. No hydronephrosis. No hydroureter.  No nephroureterolithiasis. The urinary bladder is unremarkable. Stomach/Bowel: Stomach is within normal limits. Second portion of the duodenum diverticula no evidence of bowel wall thickening or dilatation. Colonic diverticulosis. Appendix appears normal. Vascular/Lymphatic: No abdominal aorta or iliac aneurysm. Severe atherosclerotic plaque of the aorta and its branches. No abdominal, pelvic, or inguinal lymphadenopathy. Reproductive: No mass. Prostate not visualized and likely surgically removed. Other: No intraperitoneal free fluid. No intraperitoneal free gas. No organized fluid collection. Musculoskeletal: No abdominal wall hernia or abnormality. No suspicious lytic or blastic osseous lesions. No acute displaced fracture. L5-S1 degenerative changes. IMPRESSION: 1. Left lower lobe ground glass and consolidation airspace opacities. Findings suggestive of infection/inflammation. 2. Second portion of the duodenum  diverticula and colonic diverticulosis with no acute diverticulitis. 3.  Aortic Atherosclerosis (ICD10-I70.0). 4. Limited evaluation on this noncontrast study. Electronically Signed   By: Morgane  Naveau M.D.   On: 05/17/2024 20:59   DG Elbow 2 Views Left Result Date: 05/17/2024 CLINICAL DATA:  fall w/ BL foot and elbow pain EXAM: LEFT ELBOW - 2 VIEW COMPARISON:  None Available. FINDINGS: Right elbow No acute fracture or dislocation. While the lateral radiograph is suboptimal due to patient positioning, no large elbow joint effusion is visualized. Soft tissues are unremarkable. Left elbow No acute fracture or dislocation. While the lateral radiograph is suboptimal due to patient positioning, no large elbow joint effusion is visualized. Soft tissues are unremarkable. IMPRESSION: Both lateral radiographs are suboptimal due to patient positioning, which limits evaluation for a nondisplaced elbow fracture. No large elbow joint effusion, acute, displaced fracture, or dislocation in either  elbow. If concern persists for elbow joint fracture, repeat imaging with improved positioning on the lateral radiographs would be recommended. Electronically Signed   By: Rogelia Myers M.D.   On: 05/17/2024 18:01   DG Elbow 2 Views Right Result Date: 05/17/2024 CLINICAL DATA:  fall w/ BL foot and elbow pain EXAM: RIGHT ELBOW - 2 VIEW COMPARISON:  None Available. FINDINGS: Right elbow No acute fracture or dislocation. While the lateral radiograph is suboptimal due to patient positioning, no large elbow joint effusion is visualized. Soft tissues are unremarkable. Left elbow No acute fracture or dislocation. While the lateral radiograph is suboptimal due to patient positioning, no large elbow joint effusion is visualized. Soft tissues are unremarkable. IMPRESSION: Both lateral radiographs are suboptimal due to patient positioning, which limits evaluation for a nondisplaced elbow fracture. No large elbow joint effusion, acute,  displaced fracture, or dislocation in either elbow. If concern persists for elbow joint fracture, repeat imaging with improved positioning on the lateral radiographs would be recommended. Electronically Signed   By: Rogelia Myers M.D.   On: 05/17/2024 18:00   DG Foot Complete Left Result Date: 05/17/2024 CLINICAL DATA:  fall w/ BL foot and elbow pain EXAM: LEFT FOOT - COMPLETE 3+ VIEW COMPARISON:  None Available. FINDINGS: No acute fracture or dislocation. Small undersurface calcaneal heel spur. Small Achilles insertion enthesophyte. Soft tissues are unremarkable. No radiopaque foreign body. IMPRESSION: No acute fracture or dislocation. Electronically Signed   By: Rogelia Myers M.D.   On: 05/17/2024 17:57   DG Foot Complete Right Result Date: 05/17/2024 CLINICAL DATA:  fall w/ BL foot and elbow pain EXAM: RIGHT FOOT COMPLETE - 3+ VIEW COMPARISON:  None Available. FINDINGS: No acute fracture or dislocation. There is no evidence of arthropathy or other focal bone abnormality. Soft tissues are unremarkable. No radiopaque foreign body. IMPRESSION: No acute fracture or dislocation. Electronically Signed   By: Rogelia Myers M.D.   On: 05/17/2024 17:56    Scheduled Inpatient Medications:    dicyclomine   10 mg Oral TID AC   fluticasone   1 spray Each Nare Daily   gabapentin   100 mg Oral TID   Gerhardt's butt cream   Topical TID   guaiFENesin   600 mg Oral BID   insulin  aspart  0-5 Units Subcutaneous QHS   insulin  aspart  0-9 Units Subcutaneous TID WC   ipratropium-albuterol   3 mL Nebulization BID   metoprolol  tartrate  5 mg Intravenous Once   metoprolol  tartrate  25 mg Oral BID   pantoprazole  (PROTONIX ) IV  40 mg Intravenous Q12H   potassium chloride  SA  20 mEq Oral Daily   QUEtiapine   100 mg Oral QHS    Continuous Inpatient Infusions:    sodium chloride  150 mL/hr at 05/19/24 1429   azithromycin  Stopped (05/19/24 0016)   cefTRIAXone  (ROCEPHIN )  IV Stopped (05/18/24 2309)    PRN Inpatient  Medications:  acetaminophen  **OR** acetaminophen , chlorpheniramine-HYDROcodone, levalbuterol , ondansetron  **OR** ondansetron  (ZOFRAN ) IV, traMADol , zolpidem   Miscellaneous: N/A  Assessment:  Hypokalemia - Being managed by primary team. Possibly from diarrhea. Now Normal at 3.5. Chronic pain syndrome. Therapeutic opioid dependence. AKI Melena Anemia presumably from GI blood loss. Hgb down to 10.1 from 11.1Consider UGI source primarily given melena but would benefit from lower luminal evaluation as well to rule out a right sided colonic lesion such as diverticula or colon neoplasm. Elevated aminotransferases - May be secondary to hypotensive episode from volume loss vs. Alcohol vs. Toxic exposure vs. Sepsis. Monitoring. Acute Kidney Injury -  Likely from diarrhea/dehydration. Sepsis from Pneumonia - Per Medicine team. On IV antibiotics.    Plan:  Continue serial monitoring. EGD and colonoscopy when clinically feasible. Pulmonary status and electrolytes still not quite optimized for endoluminal evaluation tomorrow. Dr. Jinny will see patient tomorrow and make further recommendations on timing of evaluation.  Minh Roanhorse K. Aundria, M.D. 05/19/2024, 2:34 PM

## 2024-05-19 NOTE — Assessment & Plan Note (Addendum)
 Patient with slowly worsening thrombocytopenia, WBC and hemoglobin with small improvement. Diarrhea improving, C. difficile and GI pathogen panel negative, likely some viral illness. - Monitor CBC

## 2024-05-19 NOTE — Consult Note (Signed)
 PHARMACY CONSULT NOTE - ELECTROLYTES  Pharmacy Consult for Electrolyte Monitoring and Replacement   Recent Labs: Potassium (mmol/L)  Date Value  05/19/2024 3.3 (L)  05/01/2012 3.5   Magnesium  (mg/dL)  Date Value  91/89/7974 2.3  04/30/2012 1.8   Calcium  (mg/dL)  Date Value  91/89/7974 7.5 (L)   Calcium , Total (mg/dL)  Date Value  92/76/7986 8.9   Albumin  (g/dL)  Date Value  91/89/7974 2.9 (L)  04/30/2012 3.5   Phosphorus (mg/dL)  Date Value  91/89/7974 <1.0 (LL)   Sodium (mmol/L)  Date Value  05/19/2024 137  05/01/2012 142   Height: 5' 7 (170.2 cm) Weight: 78.9 kg (173 lb 15.1 oz) IBW/kg (Calculated) : 66.1 Estimated Creatinine Clearance: 74.6 mL/min (by C-G formula based on SCr of 0.79 mg/dL).  Assessment  David Christensen is a 73 y.o. male presenting with GIB, AKI, hypokalemia. PMH significant for GERD, depression, type 2 diabetes mellitus, hypertension and PTSD. Pharmacy has been consulted to monitor and replace electrolytes.  Diet: NPO MIVF: NS at 150 cc/hr Pertinent medications: Potassium phosphate  30 mmol IV x 1 and Kcl 40 mEq PO x 1  Goal of Therapy: Electrolytes within normal limits  Plan:  K 3.3, Phos <1.0. Provider order NaPhos x 1 Follow up on AM labs  Thank you for allowing pharmacy to be a part of this patient's care.  Olam Fritter, PharmD, BCPS 05/19/2024 6:51 PM

## 2024-05-19 NOTE — Assessment & Plan Note (Signed)
 Resolved.  Renal function now at baseline This is likely prerenal due to volume depletion and dehydration. Renal ultrasound was negative for any acute abnormality, did show bilateral simple renal cyst with no follow-up recommendations.   -Monitor renal function -Avoid nephrotoxins

## 2024-05-19 NOTE — Assessment & Plan Note (Signed)
 Likely due to GI losses, severe hypokalemia on presentation, now improved to 3.5 Worsening hypophosphatemia today with phosphorus less than 1 -Replete electrolytes aggressively and monitor

## 2024-05-19 NOTE — Assessment & Plan Note (Addendum)
 Blood pressure now started trending up -Restarting home HCTZ and losartan  -Continue with metoprolol  -Monitor blood pressure closely

## 2024-05-19 NOTE — Progress Notes (Signed)
 Progress Note   Patient: David Christensen FMW:[id number] DOB: Sep 06, 1951 DOA: 05/17/2024     2 DOS: the patient was seen and examined on 05/19/2024   Brief hospital course: Taken from H&P.  David Christensen is a 73 y.o. Caucasian male with medical history significant for GERD, depression, hypertension and PTSD, who presented to the emergency room with diarrhea over the last several weeks and recent melena with no bright red bleeding per rectum.  He has been having mild left lower quadrant lower abdominal pain and generalized weakness and fatigue.He has been having difficulty standing without assistance and had multiple falls over the past few days including today.SABRA   On presentation patient had mild tachycardia and tachypnea, labs with mild hyponatremia at 133, severe hypokalemia with potassium less than 2, CO2 13, BUN 59, creatinine 2.27, calcium  8.8 and anion gap of 27.  Elevated liver enzymes with AST 516, ALT 473, T. bili 1.9, lactic acid 2.1>> 1.7, leukocytosis 13.6, macrocytosis. Type and screen was done with blood group of [place] and negative antibody screen.  EKG with sinus tachycardia.  Imaging which include bilateral feet and elbow was negative for any acute fractures. CT abdomen and pelvis with concern of left lower lobe groundglass opacity/consolidation, suggestive of infection/inflammation.  Also noted second portion of duodenal diverticuli and chronic diverticulosis with no diverticulitis.  Patient was started on Rocephin  and Zithromax  along with IV Protonix . Electrolyte replacement ordered.  GI was consulted for concern of GI bleed.  8/9: Remained tachycardic, labs with mild leukopenia 2.4, hemoglobin 11.1, MCV 103 and platelets of 103.  Sodium 134, potassium 2.3, CO2 13, BUN 55 and creatinine with small improvement 1.87.  Improving transaminitis.  Preliminary blood cultures negative in 12-hour.  Replating more potassium. Ordered GI pathogen panel and COVID test.  8/10; vitals stable, labs  with significant hypophosphatemia listed as less than 1, potassium 3.5, replating electrolyte, seems like developing pancytopenia with WBC 1.6, platelet 81 and hemoglobin 10.1-C. difficile antigen positive and negative toxin and PCR, GI pathogen panel negative, significant macrocytosis-anemia panel consistent with anemia of chronic disease, pending B12.  Persistent transaminitis-ordered hepatitis panel.  Blood cultures remain negative. Pending PT and OT evaluation.  Assessment and Plan: * GI bleeding Per patient he was having some melena, no NSAID use. Hemoglobin with some decreased to 10.1 this morning, it was 13.6 on admission,  GI was consulted-patient likely need EGD and colonoscopy and they would like to correct electrolyte abnormalities before proceeding. Anemia panel consistent with anemia of chronic disease, B12 pending - Monitor hemoglobin -Transfuse if below 7  Sepsis due to pneumonia (HCC) Sepsis is manifested by tachypnea and tachycardia. Chest imaging concerning for pneumonia, preliminary blood cultures negative. -Continue with ceftriaxone  and Zithromax  -Continue with supportive care  Hypokalemia Likely due to GI losses, severe hypokalemia on presentation, now improved to 3.5 Worsening hypophosphatemia today with phosphorus less than 1 -Replete electrolytes aggressively and monitor  Pancytopenia (HCC) Patient with slowly worsening pancytopenia with WBC at 1.6, hemoglobin 10.1 and platelet of 81 today. Diarrhea improving, C. difficile and GI pathogen panel negative, likely some viral illness. - Monitor CBC  AKI (acute kidney injury) (HCC) Resolved.  Renal function now at baseline This is likely prerenal due to volume depletion and dehydration. Renal ultrasound was negative for any acute abnormality, did show bilateral simple renal cyst with no follow-up recommendations.   -Monitor renal function -Avoid nephrotoxins  Elevated LFTs This could be related to significant  volume depletion and hepatorenal syndrome.  Fluctuating and  persistent transaminitis. -Ordered hepatitis panel -Monitor liver function -Holding statin  Essential hypertension Blood pressure within lower normal goal.  Patient was on losartan , HCTZ, amlodipine , metoprolol  and Minipress at home - Holding home antihypertensives -Starting low-dose metoprolol  -Monitor blood pressure closely  Depression Will continue Zoloft  at and Seroquel  as well as trazodone .  Dyslipidemia - Will hold off statin therapy given significantly elevated LFTs.   Subjective: Patient continued to feel weak, diarrhea improving.  He was complaining of shoulder pain.  Physical Exam: Vitals:   05/19/24 0417 05/19/24 0725 05/19/24 0835 05/19/24 1132  BP: (!) 128/56  (!) 140/72 120/77  Pulse: 81  87 74  Resp: 17     Temp: 99.3 F (37.4 C)  98.3 F (36.8 C) 98.8 F (37.1 C)  TempSrc: Oral  Oral   SpO2: 94% 94% 91% 90%  Weight:      Height:       General.  Ill-appearing elderly man, in no acute distress. Pulmonary.  Lungs clear bilaterally, normal respiratory effort. CV.  Regular rate and rhythm, no JVD, rub or murmur. Abdomen.  Soft, nontender, nondistended, BS positive. CNS.  Alert and oriented .  No focal neurologic deficit. Extremities.  No edema,  pulses intact and symmetrical.   Data Reviewed: Prior data reviewed.  Family Communication: Unable to reach daughter on phone.  Disposition: Status is: Inpatient Remains inpatient appropriate because: Severity of illness  Planned Discharge Destination: Home with Home Health  Time spent: 50 minutes  This record has been created using Conservation officer, historic buildings. Errors have been sought and corrected,but may not always be located. Such creation errors do not reflect on the standard of care.   Author: Amaryllis Dare, MD 05/19/2024 1:27 PM  For on call review [email or web address].

## 2024-05-19 NOTE — Consult Note (Signed)
 PHARMACY CONSULT NOTE - ELECTROLYTES  Pharmacy Consult for Electrolyte Monitoring and Replacement   Recent Labs: Potassium (mmol/L)  Date Value  05/19/2024 3.5  05/01/2012 3.5   Magnesium  (mg/dL)  Date Value  91/89/7974 2.3  04/30/2012 1.8   Calcium  (mg/dL)  Date Value  91/89/7974 7.5 (L)   Calcium , Total (mg/dL)  Date Value  92/76/7986 8.9   Albumin  (g/dL)  Date Value  91/89/7974 2.9 (L)  04/30/2012 3.5   Phosphorus (mg/dL)  Date Value  91/89/7974 <1.0 (LL)   Sodium (mmol/L)  Date Value  05/19/2024 137  05/01/2012 142   Height: 5' 7 (170.2 cm) Weight: 78.9 kg (173 lb 15.1 oz) IBW/kg (Calculated) : 66.1 Estimated Creatinine Clearance: 74.6 mL/min (by C-G formula based on SCr of 0.79 mg/dL).  Assessment  David Christensen is a 73 y.o. male presenting with GIB, AKI, hypokalemia. PMH significant for GERD, depression, type 2 diabetes mellitus, hypertension and PTSD. Pharmacy has been consulted to monitor and replace electrolytes.  Diet: NPO MIVF: NS at 150 cc/hr Pertinent medications: Potassium phosphate  30 mmol IV x 1 and Kcl 40 mEq PO x 1  Goal of Therapy: Electrolytes within normal limits  Plan:  K 3.5, Kcl 40 mEq PO x 1 dose Re-check K+ and Phos at 1800 today  Thank you for allowing pharmacy to be a part of this patient's care.  Marolyn KATHEE Mare 05/19/2024 6:52 AM

## 2024-05-19 NOTE — Assessment & Plan Note (Addendum)
 This could be related to significant volume depletion and hepatorenal syndrome. -Hepatitis panel negative Slowly improving and T. bili has been normalized -Monitor liver function -Holding statin

## 2024-05-19 NOTE — Plan of Care (Signed)
   Problem: Nutritional: Goal: Maintenance of adequate nutrition will improve Outcome: Progressing   Problem: Education: Goal: Knowledge of General Education information will improve Description: Including pain rating scale, medication(s)/side effects and non-pharmacologic comfort measures Outcome: Progressing

## 2024-05-20 DIAGNOSIS — J189 Pneumonia, unspecified organism: Secondary | ICD-10-CM | POA: Diagnosis not present

## 2024-05-20 DIAGNOSIS — R7989 Other specified abnormal findings of blood chemistry: Secondary | ICD-10-CM | POA: Diagnosis not present

## 2024-05-20 DIAGNOSIS — K922 Gastrointestinal hemorrhage, unspecified: Secondary | ICD-10-CM | POA: Diagnosis not present

## 2024-05-20 DIAGNOSIS — N179 Acute kidney failure, unspecified: Secondary | ICD-10-CM | POA: Diagnosis not present

## 2024-05-20 DIAGNOSIS — E876 Hypokalemia: Secondary | ICD-10-CM | POA: Diagnosis not present

## 2024-05-20 DIAGNOSIS — K921 Melena: Secondary | ICD-10-CM

## 2024-05-20 LAB — GLUCOSE, CAPILLARY
Glucose-Capillary: 218 mg/dL — ABNORMAL HIGH (ref 70–99)
Glucose-Capillary: 160 mg/dL — ABNORMAL HIGH (ref 70–99)
Glucose-Capillary: 153 mg/dL — ABNORMAL HIGH (ref 70–99)
Glucose-Capillary: 171 mg/dL — ABNORMAL HIGH (ref 70–99)
Glucose-Capillary: 140 mg/dL — ABNORMAL HIGH (ref 70–99)
Glucose-Capillary: 137 mg/dL — ABNORMAL HIGH (ref 70–99)

## 2024-05-20 LAB — CBC
HCT: 32.2 % — ABNORMAL LOW (ref 39.0–52.0)
Hemoglobin: 11.7 g/dL — ABNORMAL LOW (ref 13.0–17.0)
MCH: 38.1 pg — ABNORMAL HIGH (ref 26.0–34.0)
MCHC: 36.3 g/dL — ABNORMAL HIGH (ref 30.0–36.0)
MCV: 104.9 fL — ABNORMAL HIGH (ref 80.0–100.0)
Platelets: 71 K/uL — ABNORMAL LOW (ref 150–400)
RBC: 3.07 MIL/uL — ABNORMAL LOW (ref 4.22–5.81)
RDW: 17.9 % — ABNORMAL HIGH (ref 11.5–15.5)
WBC: 1.8 K/uL — ABNORMAL LOW (ref 4.0–10.5)
nRBC: 6.6 % — ABNORMAL HIGH (ref 0.0–0.2)

## 2024-05-20 LAB — BASIC METABOLIC PANEL WITH GFR
Anion gap: 10 (ref 5–15)
BUN: 7 mg/dL — ABNORMAL LOW (ref 8–23)
CO2: 22 mmol/L (ref 22–32)
Calcium: 7.6 mg/dL — ABNORMAL LOW (ref 8.9–10.3)
Chloride: 111 mmol/L (ref 98–111)
Creatinine, Ser: 0.6 mg/dL — ABNORMAL LOW (ref 0.61–1.24)
GFR, Estimated: >60 mL/min (ref >60)
Glucose, Bld: 134 mg/dL — ABNORMAL HIGH (ref 70–99)
Potassium: 3.3 mmol/L — ABNORMAL LOW (ref 3.5–5.1)
Sodium: 143 mmol/L (ref 135–145)

## 2024-05-20 LAB — MRSA NEXT GEN BY PCR, NASAL: MRSA by PCR Next Gen: NOT DETECTED

## 2024-05-20 LAB — HEPATITIS PANEL, ACUTE
HCV Ab: NONREACTIVE
Hep A IgM: NONREACTIVE
Hep B C IgM: NONREACTIVE
Hepatitis B Surface Ag: NONREACTIVE

## 2024-05-20 LAB — MAGNESIUM: Magnesium: 1.8 mg/dL (ref 1.7–2.4)

## 2024-05-20 LAB — HEMOGLOBIN A1C
Hgb A1c MFr Bld: 5.6 % (ref 4.8–5.6)
Mean Plasma Glucose: 114 mg/dL

## 2024-05-20 LAB — PHOSPHORUS: Phosphorus: 1.2 mg/dL — ABNORMAL LOW (ref 2.5–4.6)

## 2024-05-20 MED ORDER — POTASSIUM PHOSPHATES 15 MMOLE/5ML IV SOLN
30.0000 mmol | Freq: Once | INTRAVENOUS | Status: AC
  Administered 2024-05-20 (×2): 30 mmol via INTRAVENOUS
  Filled 2024-05-20: qty 10

## 2024-05-20 MED ORDER — MAGNESIUM SULFATE 2 GM/50ML IV SOLN
2.0000 g | Freq: Once | INTRAVENOUS | Status: AC
  Administered 2024-05-20 (×2): 2 g via INTRAVENOUS
  Filled 2024-05-20: qty 50

## 2024-05-20 MED ORDER — POTASSIUM CHLORIDE CRYS ER 20 MEQ PO TBCR
40.0000 meq | EXTENDED_RELEASE_TABLET | Freq: Once | ORAL | Status: AC
  Administered 2024-05-20 (×2): 40 meq via ORAL
  Filled 2024-05-20: qty 2

## 2024-05-20 NOTE — Progress Notes (Signed)
 Progress Note   Patient: Henok Heacock FMW:[id number] DOB: Feb 13, 1951 DOA: 05/17/2024     3 DOS: the patient was seen and examined on 05/20/2024   Brief hospital course: Taken from H&P.  Najeeb Uptain is a 73 y.o. Caucasian male with medical history significant for GERD, depression, hypertension and PTSD, who presented to the emergency room with diarrhea over the last several weeks and recent melena with no bright red bleeding per rectum.  He has been having mild left lower quadrant lower abdominal pain and generalized weakness and fatigue.He has been having difficulty standing without assistance and had multiple falls over the past few days including today.SABRA   On presentation patient had mild tachycardia and tachypnea, labs with mild hyponatremia at 133, severe hypokalemia with potassium less than 2, CO2 13, BUN 59, creatinine 2.27, calcium  8.8 and anion gap of 27.  Elevated liver enzymes with AST 516, ALT 473, T. bili 1.9, lactic acid 2.1>> 1.7, leukocytosis 13.6, macrocytosis. Type and screen was done with blood group of [place] and negative antibody screen.  EKG with sinus tachycardia.  Imaging which include bilateral feet and elbow was negative for any acute fractures. CT abdomen and pelvis with concern of left lower lobe groundglass opacity/consolidation, suggestive of infection/inflammation.  Also noted second portion of duodenal diverticuli and chronic diverticulosis with no diverticulitis.  Patient was started on Rocephin  and Zithromax  along with IV Protonix . Electrolyte replacement ordered.  GI was consulted for concern of GI bleed.  8/9: Remained tachycardic, labs with mild leukopenia 2.4, hemoglobin 11.1, MCV 103 and platelets of 103.  Sodium 134, potassium 2.3, CO2 13, BUN 55 and creatinine with small improvement 1.87.  Improving transaminitis.  Preliminary blood cultures negative in 12-hour.  Replating more potassium. Ordered GI pathogen panel and COVID test.  8/10; vitals stable, labs  with significant hypophosphatemia listed as less than 1, potassium 3.5, replating electrolyte, seems like developing pancytopenia with WBC 1.6, platelet 81 and hemoglobin 10.1-C. difficile antigen positive and negative toxin and PCR, GI pathogen panel negative, significant macrocytosis-anemia panel consistent with anemia of chronic disease, pending B12.  Persistent transaminitis-ordered hepatitis panel.  Blood cultures remain negative. Pending PT and OT evaluation.  8/11: Hemodynamically stable, diarrhea improved.  Persistent but slowly improving hypophosphatemia, borderline magnesium  and hypokalemia-electrolytes are being repleted.  B12 normal at 794.  Hepatitis panel negative.  PT is recommending home health.  Assessment and Plan: * GI bleeding Per patient he was having some melena, no NSAID use. Hemoglobin with some decreased to 10.1 this morning, it was 13.6 on admission,  GI was consulted-patient likely need EGD and colonoscopy and they would like to correct electrolyte abnormalities before proceeding. Anemia panel consistent with anemia of chronic disease, B12 at 794 - Monitor hemoglobin -Transfuse if below 7  Sepsis due to pneumonia (HCC) Sepsis is manifested by tachypnea and tachycardia. Chest imaging concerning for pneumonia, preliminary blood cultures negative. -Continue with ceftriaxone  and Zithromax  -Continue with supportive care  Hypokalemia Hypophosphatemia Likely due to GI losses, severe hypokalemia on presentation, potassium at 3.3 today Small improvement in phosphorous now at 1.2 -Replete electrolytes aggressively and monitor  Pancytopenia (HCC) Patient with slowly worsening thrombocytopenia, WBC and hemoglobin with small improvement. Diarrhea improving, C. difficile and GI pathogen panel negative, likely some viral illness. - Monitor CBC  AKI (acute kidney injury) (HCC) Resolved.  Renal function now at baseline This is likely prerenal due to volume depletion and  dehydration. Renal ultrasound was negative for any acute abnormality, did show bilateral simple  renal cyst with no follow-up recommendations.   -Monitor renal function -Avoid nephrotoxins  Elevated LFTs This could be related to significant volume depletion and hepatorenal syndrome.  Fluctuating and persistent transaminitis. -Hepatitis panel negative -Monitor liver function -Holding statin  Essential hypertension Blood pressure within lower normal goal.  Patient was on losartan , HCTZ, amlodipine , metoprolol  and Minipress at home - Holding home antihypertensives -Starting low-dose metoprolol  -Monitor blood pressure closely  Depression Will continue Zoloft  at and Seroquel  as well as trazodone .  Dyslipidemia - Will hold off statin therapy given significantly elevated LFTs.   Subjective: Patient was seen and examined today.  No more diarrhea, having brown-colored stool.  Still feeling weak.  Physical Exam: Vitals:   05/20/24 0416 05/20/24 0733 05/20/24 0825 05/20/24 1138  BP: (!) 146/72 129/60  128/68  Pulse: 72 72  67  Resp: 20 18  18   Temp: 98.2 F (36.8 C) 98.6 F (37 C)    TempSrc: Oral     SpO2: 91% 90% 91% 92%  Weight:      Height:       General.  Frail elderly man, in no acute distress. Pulmonary.  Lungs clear bilaterally, normal respiratory effort. CV.  Regular rate and rhythm, no JVD, rub or murmur. Abdomen.  Soft, nontender, nondistended, BS positive. CNS.  Alert and oriented .  No focal neurologic deficit. Extremities.  No edema, no cyanosis, pulses intact and symmetrical. Psychiatry.  Judgment and insight appears normal.   Data Reviewed: Prior data reviewed.  Family Communication:   Disposition: Status is: Inpatient Remains inpatient appropriate because: Severity of illness  Planned Discharge Destination: Home with Home Health  Time spent: 50 minutes  This record has been created using Conservation officer, historic buildings. Errors have been sought and  corrected,but may not always be located. Such creation errors do not reflect on the standard of care.   Author: Amaryllis Dare, MD 05/20/2024 2:22 PM  For on call review [email or web address].

## 2024-05-20 NOTE — Evaluation (Signed)
 Physical Therapy Evaluation Patient Details Name: David Christensen MRN: [id number] DOB: March 13, 1951 Today's Date: 05/20/2024  History of Present Illness  Pt is a 73 y.o. caucasian male with medical history significant for GERD, depression, type 2 DM, HTN and PTSD, who presented to the ER with acute onset of diarrhea and recent melena with no BRBPR.  He has been having mild left lower quadrant lower abdominal pain and generalized weakness and fatigue that has been worsening.  He has been having difficulty standing without assistance and had multiple recent falls.  He admitted to cough without significant dyspnea or wheezing.  MD assessment includes: GI bleeding, sepsis due to PNA, hypokalemia, pancytopenia, AKI, and elevated LFT's.   Clinical Impression  Pt was pleasant and motivated to participate during the session and put forth good effort throughout. Pt required no physical assistance during the session and demonstrated good eccentric and concentric control and stability during transfer training from multiple height surfaces.  Pt was able to amb 20 feet with slow cadence but steady with no overt LOB with distance limited by need for BM; difficult to obtain SpO2 reading due to poor pleth but SpO2 appeared to range from 87-88% during the session on 2.5LO2/min, nsg notified.  Pt has a history of falls including recent falls all secondary to LOB per pt report and pt will benefit from continued PT services upon discharge to safely address deficits listed in patient problem list for decreased caregiver assistance and eventual return to PLOF.          If plan is discharge home, recommend the following: A little help with walking and/or transfers;A little help with bathing/dressing/bathroom;Assistance with cooking/housework;Help with stairs or ramp for entrance;Assist for transportation   Can travel by private vehicle        Equipment Recommendations None recommended by PT  Recommendations for Other  Services       Functional Status Assessment Patient has had a recent decline in their functional status and demonstrates the ability to make significant improvements in function in a reasonable and predictable amount of time.     Precautions / Restrictions Precautions Precautions: Fall Restrictions Weight Bearing Restrictions Per Provider Order: No Other Position/Activity Restrictions: watch SpO2      Mobility  Bed Mobility Overal bed mobility: Modified Independent             General bed mobility comments: Min extra time and effort only    Transfers Overall transfer level: Needs assistance Equipment used: Rolling walker (2 wheels) Transfers: Sit to/from Stand Sit to Stand: Supervision           General transfer comment: Good eccentric and concentric control and stability from multiple height surfaces with min to mod verbal cues for hand placement    Ambulation/Gait Ambulation/Gait assistance: Contact guard assist Gait Distance (Feet): 20 Feet Assistive device: Rolling walker (2 wheels) Gait Pattern/deviations: Step-through pattern, Decreased step length - right, Decreased step length - left, Trunk flexed Gait velocity: decreased     General Gait Details: Slow cadence but steady with no overt LOB with distance limited by need for BM; difficult to obtain SpO2 reading due to poor pleth but SpO2 appeared to range from 87-88% during the session on 2.5LO2/min  Stairs            Wheelchair Mobility     Tilt Bed    Modified Rankin (Stroke Patients Only)       Balance Overall balance assessment: Needs assistance, History of Falls   Sitting  balance-Leahy Scale: Normal     Standing balance support: Bilateral upper extremity supported, During functional activity Standing balance-Leahy Scale: Good                               Pertinent Vitals/Pain Pain Assessment Pain Assessment: 0-10 Pain Score: 3  Pain Location: abdomen Pain  Descriptors / Indicators: Sore, Sharp Pain Intervention(s): Monitored during session, Repositioned, Patient requesting pain meds-RN notified    Home Living Family/patient expects to be discharged to:: Private residence Living Arrangements: Alone Available Help at Discharge: Family;Available 24 hours/day Type of Home: House Home Access: Stairs to enter Entrance Stairs-Rails: Right;Left;Can reach both Entrance Stairs-Number of Steps: 3 Alternate Level Stairs-Number of Steps: Pt reports that he lives in a 2 level house but reports that his bed/bath are on 1st floor Home Layout: One level Home Equipment: Shower seat - built [employer] fundraiser (2 wheels) Additional Comments: Armrests over the toilet; daughter coming in to stay with pt as needed upon discharge    Prior Function Prior Level of Function : Independent/Modified Independent;History of Falls (last six months)             Mobility Comments: Ind amb community distances without an AD, 10 falls in the last 6 months secondary to LOB ADLs Comments: Ind with ADLs     Extremity/Trunk Assessment   Upper Extremity Assessment Upper Extremity Assessment: Generalized weakness    Lower Extremity Assessment Lower Extremity Assessment: Generalized weakness       Communication   Communication Communication: No apparent difficulties    Cognition Arousal: Alert Behavior During Therapy: WFL for tasks assessed/performed   PT - Cognitive impairments: No apparent impairments                         Following commands: Intact       Cueing Cueing Techniques: Verbal cues     General Comments      Exercises     Assessment/Plan    PT Assessment Patient needs continued PT services  PT Problem List Decreased strength;Decreased activity tolerance;Decreased balance;Decreased mobility;Decreased knowledge of use of DME;Pain       PT Treatment Interventions DME instruction;Gait training;Stair training;Functional  mobility training;Therapeutic activities;Therapeutic exercise;Balance training;Patient/family education    PT Goals (Current goals can be found in the Care Plan section)  Acute Rehab PT Goals Patient Stated Goal: Improved balance PT Goal Formulation: With patient Time For Goal Achievement: 06/02/24 Potential to Achieve Goals: Good    Frequency Min 2X/week     Co-evaluation               AM-PAC PT 6 Clicks Mobility  Outcome Measure Help needed turning from your back to your side while in a flat bed without using bedrails?: None Help needed moving from lying on your back to sitting on the side of a flat bed without using bedrails?: None Help needed moving to and from a bed to a chair (including a wheelchair)?: A Little Help needed standing up from a chair using your arms (e.g., wheelchair or bedside chair)?: A Little Help needed to walk in hospital room?: A Little Help needed climbing 3-5 steps with a railing? : A Lot 6 Click Score: 19    End of Session Equipment Utilized During Treatment: Gait belt;Oxygen Activity Tolerance: Patient tolerated treatment well Patient left: in bed;with call bell/phone within reach;with bed alarm set Nurse Communication: Mobility status;Other (comment) (Bed  alarm not functioning, SpO2 87-88% during session) PT Visit Diagnosis: Unsteadiness on feet (R26.81);History of falling (Z91.81);Difficulty in walking, not elsewhere classified (R26.2);Muscle weakness (generalized) (M62.81);Pain Pain - part of body:  (abdominal pain)    Time: 9060-8991 PT Time Calculation (min) (ACUTE ONLY): 29 min   Charges:   PT Evaluation $PT Eval Moderate Complexity: 1 Mod PT Treatments $Therapeutic Activity: 8-22 mins PT General Charges $$ ACUTE PT VISIT: 1 Visit    D. Scott Marikay Roads PT, DPT 05/20/24, 11:52 AM

## 2024-05-20 NOTE — Consult Note (Addendum)
 PHARMACY CONSULT NOTE - ELECTROLYTES  Pharmacy Consult for Electrolyte Monitoring and Replacement   Recent Labs: Potassium (mmol/L)  Date Value  05/20/2024 3.3 (L)  05/01/2012 3.5   Magnesium  (mg/dL)  Date Value  91/88/7974 1.8  04/30/2012 1.8   Calcium  (mg/dL)  Date Value  91/88/7974 7.6 (L)   Calcium , Total (mg/dL)  Date Value  92/76/7986 8.9   Albumin  (g/dL)  Date Value  91/89/7974 2.9 (L)  04/30/2012 3.5   Phosphorus (mg/dL)  Date Value  91/88/7974 1.2 (L)   Sodium (mmol/L)  Date Value  05/20/2024 143  05/01/2012 142   Height: 5' 7 (170.2 cm) Weight: 78.9 kg (173 lb 15.1 oz) IBW/kg (Calculated) : 66.1 Estimated Creatinine Clearance: 74.6 mL/min (A) (by C-G formula based on SCr of 0.6 mg/dL (L)).  Assessment  David Christensen is a 73 y.o. male presenting with GIB, AKI, hypokalemia. PMH significant for GERD, depression, type 2 diabetes mellitus, hypertension and PTSD. Pharmacy has been consulted to monitor and replace electrolytes.  Diet: carb modified MIVF: NS at 150 cc/hr Pertinent medications: Kcl po 20meq daily - HELD on 8/11  Goal of Therapy: Electrolytes within normal limits  Plan:  K 3.3, Phos 1.2. Will order Kphos 30 mmol IV x 1 dose plus Kcl 40meq po x 1 dose. Mg 1.8, Will replace with MgSulfate 2gm IV x 1 dose. Check renal function panel 6-10 hours after completing Kphos infusion. Continue to follow AM labs.  David Christensen PharmD, BCPS 05/20/2024 7:28 AM

## 2024-05-20 NOTE — Care Management Important Message (Signed)
 Important Message  Patient Details  Name: David Christensen MRN: [id number] Date of Birth: 1950/12/16   Important Message Given:  Yes - Medicare IM     Rojelio [name] 05/20/2024, 12:39 PM

## 2024-05-20 NOTE — Progress Notes (Signed)
 Rogelia Copping, MD [facility]   [address], [address] [place], [place] [address] Phone: [phone] Fax : [phone]   Subjective: The patient hemoglobin is improved from yesterday with his hemoglobin today being 11.7.  The patient continues to have respiratory issues and on oxygen.  The patient's liver enzymes are elevated as of yesterday with a total bilirubin of 1.6 with the AST and ALT also elevated.  The patient has had no further melena.  His phosphorus continues to be low.   Objective: Vital signs in last 24 hours: Vitals:   05/20/24 0733 05/20/24 0825 05/20/24 1138 05/20/24 1451  BP: 129/60  128/68 128/85  Pulse: 72  67 72  Resp: 18  18 19   Temp: 98.6 F (37 C)   98.9 F (37.2 C)  TempSrc:      SpO2: 90% 91% 92% 94%  Weight:      Height:       Weight change:   Intake/Output Summary (Last 24 hours) at 05/20/2024 1600 Last data filed at 05/20/2024 1410 Gross per 24 hour  Intake 4408.67 ml  Output 950 ml  Net 3458.67 ml     Exam: Heart:: Regular rate and rhythm or without murmur or extra heart sounds Lungs: expiratory rhonchi Abdomen: soft, nontender, normal bowel sounds   Lab Results: @LABTEST2 @ Micro Results: Recent Results (from the past 240 hours)  Culture, blood (x 2)     Status: None (Preliminary result)   Collection Time: 05/18/24 12:07 AM   Specimen: BLOOD  Result Value Ref Range Status   Specimen Description BLOOD BLOOD LEFT HAND  Final   Special Requests   Final    BOTTLES DRAWN AEROBIC AND ANAEROBIC Blood Culture results may not be optimal due to an inadequate volume of blood received in culture bottles   Culture   Final    NO GROWTH 2 DAYS Performed at [facility], [address] [place], [place] [address]    Report Status PENDING  Incomplete  Culture, blood (x 2)     Status: None (Preliminary result)   Collection Time: 05/18/24 12:16 AM   Specimen: BLOOD  Result Value Ref Range Status   Specimen Description BLOOD BLOOD RIGHT  HAND  Final   Special Requests   Final    BOTTLES DRAWN AEROBIC ONLY Blood Culture results may not be optimal due to an inadequate volume of blood received in culture bottles   Culture   Final    NO GROWTH 2 DAYS Performed at [facility], [address] [place], [place] [address]    Report Status PENDING  Incomplete  Resp panel by RT-PCR (RSV, Flu A&B, Covid) Anterior Nasal Swab     Status: None   Collection Time: 05/18/24  1:00 PM   Specimen: Anterior Nasal Swab  Result Value Ref Range Status   SARS Coronavirus 2 by RT PCR NEGATIVE NEGATIVE Final    Comment: (NOTE) SARS-CoV-2 target nucleic acids are NOT DETECTED.  The SARS-CoV-2 RNA is generally detectable in upper respiratory specimens during the acute phase of infection. The lowest concentration of SARS-CoV-2 viral copies this assay can detect is 138 copies/mL. A negative result does not preclude SARS-Cov-2 infection and should not be used as the sole basis for treatment or other patient management decisions. A negative result may occur with  improper specimen collection/handling, submission of specimen other than nasopharyngeal swab, presence of viral mutation(s) within the areas targeted by this assay, and inadequate number of viral copies(<138 copies/mL). A  negative result must be combined with clinical observations, patient history, and epidemiological information. The expected result is Negative.  Fact Sheet for Patients:  BloggerCourse.com  Fact Sheet for Healthcare Providers:  SeriousBroker.it  This test is no t yet approved or cleared by the [place]  FDA and  has been authorized for detection and/or diagnosis of SARS-CoV-2 by FDA under an Emergency Use Authorization (EUA). This EUA will remain  in effect (meaning this test can be used) for the duration of the COVID-19 declaration under Section 564(b)(1) of the Act, 21 U.S.C.section 360bbb-3(b)(1),  unless the authorization is terminated  or revoked sooner.       Influenza A by PCR NEGATIVE NEGATIVE Final   Influenza B by PCR NEGATIVE NEGATIVE Final    Comment: (NOTE) The Xpert Xpress SARS-CoV-2/FLU/RSV plus assay is intended as an aid in the diagnosis of influenza from Nasopharyngeal swab specimens and should not be used as a sole basis for treatment. Nasal washings and aspirates are unacceptable for Xpert Xpress SARS-CoV-2/FLU/RSV testing.  Fact Sheet for Patients: BloggerCourse.com  Fact Sheet for Healthcare Providers: SeriousBroker.it  This test is not yet approved or cleared by the [place]  FDA and has been authorized for detection and/or diagnosis of SARS-CoV-2 by FDA under an Emergency Use Authorization (EUA). This EUA will remain in effect (meaning this test can be used) for the duration of the COVID-19 declaration under Section 564(b)(1) of the Act, 21 U.S.C. section 360bbb-3(b)(1), unless the authorization is terminated or revoked.     Resp Syncytial Virus by PCR NEGATIVE NEGATIVE Final    Comment: (NOTE) Fact Sheet for Patients: BloggerCourse.com  Fact Sheet for Healthcare Providers: SeriousBroker.it  This test is not yet approved or cleared by the [place]  FDA and has been authorized for detection and/or diagnosis of SARS-CoV-2 by FDA under an Emergency Use Authorization (EUA). This EUA will remain in effect (meaning this test can be used) for the duration of the COVID-19 declaration under Section 564(b)(1) of the Act, 21 U.S.C. section 360bbb-3(b)(1), unless the authorization is terminated or revoked.  Performed at [facility], [address] [place], [place] [address]   Gastrointestinal Panel by PCR , Stool     Status: None   Collection Time: 05/18/24  4:59 PM   Specimen: Stool  Result Value Ref Range Status   Campylobacter  species NOT DETECTED NOT DETECTED Final   Plesimonas shigelloides NOT DETECTED NOT DETECTED Final   Salmonella species NOT DETECTED NOT DETECTED Final   Yersinia enterocolitica NOT DETECTED NOT DETECTED Final   Vibrio species NOT DETECTED NOT DETECTED Final   Vibrio cholerae NOT DETECTED NOT DETECTED Final   Enteroaggregative E coli (EAEC) NOT DETECTED NOT DETECTED Final   Enteropathogenic E coli (EPEC) NOT DETECTED NOT DETECTED Final   Enterotoxigenic E coli (ETEC) NOT DETECTED NOT DETECTED Final   Shiga like toxin producing E coli (STEC) NOT DETECTED NOT DETECTED Final   Shigella/Enteroinvasive E coli (EIEC) NOT DETECTED NOT DETECTED Final   Cryptosporidium NOT DETECTED NOT DETECTED Final   Cyclospora cayetanensis NOT DETECTED NOT DETECTED Final   Entamoeba histolytica NOT DETECTED NOT DETECTED Final   Giardia lamblia NOT DETECTED NOT DETECTED Final   Adenovirus F40/41 NOT DETECTED NOT DETECTED Final   Astrovirus NOT DETECTED NOT DETECTED Final   Norovirus GI/GII NOT DETECTED NOT DETECTED Final   Rotavirus A NOT DETECTED NOT DETECTED Final   Sapovirus (I, II, IV, and V) NOT DETECTED NOT DETECTED Final    Comment: Performed at  [facility], [address], [place], [place] [address]  C Difficile Quick Screen w PCR reflex     Status: Abnormal   Collection Time: 05/18/24  4:59 PM   Specimen: STOOL  Result Value Ref Range Status   C Diff antigen POSITIVE (A) NEGATIVE Final   C Diff toxin NEGATIVE NEGATIVE Final   C Diff interpretation Results are indeterminate. See PCR results.  Final    Comment: Performed at [facility], [address] [place], [place] [address]  C. Diff by PCR, Reflexed     Status: None   Collection Time: 05/18/24  4:59 PM  Result Value Ref Range Status   Toxigenic C. Difficile by PCR NEGATIVE NEGATIVE Final    Comment: Patient is colonized with non toxigenic C. difficile. May not need treatment unless significant symptoms are present.    Hypervirulent Strain PRESUMPTIVE NEGATIVE PRESUMPTIVE NEGATIVE Final    Comment: Performed at [facility], [address] [place], [place] [address]  MRSA Next Gen by PCR, Nasal     Status: None   Collection Time: 05/20/24  7:00 AM   Specimen: Nasal Mucosa; Nasal Swab  Result Value Ref Range Status   MRSA by PCR Next Gen NOT DETECTED NOT DETECTED Final    Comment: (NOTE) The GeneXpert MRSA Assay (FDA approved for NASAL specimens only), is one component of a comprehensive MRSA colonization surveillance program. It is not intended to diagnose MRSA infection nor to guide or monitor treatment for MRSA infections. Test performance is not FDA approved in patients less than 81 years old. Performed at [facility], [address] [place], [place] [address]    Studies/Results: No results found. Medications: I have reviewed the patient's current medications. Scheduled Meds:  fluticasone   1 spray Each Nare Daily   gabapentin   100 mg Oral TID   Gerhardt's butt cream   Topical TID   guaiFENesin   600 mg Oral BID   insulin  aspart  0-5 Units Subcutaneous QHS   insulin  aspart  0-9 Units Subcutaneous TID WC   ipratropium-albuterol   3 mL Nebulization BID   metoprolol  tartrate  5 mg Intravenous Once   metoprolol  tartrate  25 mg Oral BID   pantoprazole  (PROTONIX ) IV  40 mg Intravenous Q12H   QUEtiapine   100 mg Oral QHS   Continuous Infusions:  azithromycin  Stopped (05/19/24 2236)   cefTRIAXone  (ROCEPHIN )  IV Stopped (05/19/24 2108)   magnesium  sulfate bolus IVPB 2 g (05/20/24 1542)   PRN Meds:.acetaminophen  **OR** acetaminophen , chlorpheniramine-HYDROcodone, levalbuterol , ondansetron  **OR** ondansetron  (ZOFRAN ) IV, traMADol , zolpidem    Assessment: Principal Problem:   GI bleeding Active Problems:   Elevated LFTs   AKI (acute kidney injury) (HCC)   Sepsis due to pneumonia [facility])   Essential hypertension   Hypokalemia   Dyslipidemia   Depression   Pancytopenia  (HCC)    Plan: With a stable hemoglobin and a possible GI bleed with his history of melena.  It was recommended that he may benefit from a EGD and colonoscopy.  The patient's liver enzymes continue to improve dramatically since admission.  I would hold off on any GI intervention at this time due to his general poor poor health.  I would recommend continued conservative management at this time.   LOS: 3 days   Rogelia Copping, MD.FACG 05/20/2024, 4:00 PM Pager [phone] 7am-5pm  Check AMION for 5pm -7am coverage and on weekends

## 2024-05-20 NOTE — Evaluation (Signed)
 Occupational Therapy Evaluation Patient Details Name: David Christensen MRN: [id number] DOB: 02/05/1951 Today's Date: 05/20/2024   History of Present Illness   Pt is a 73 y.o. caucasian male with medical history significant for GERD, depression, type 2 DM, HTN and PTSD, who presented to the ER with acute onset of diarrhea and recent melena with no BRBPR.  He has been having mild left lower quadrant lower abdominal pain and generalized weakness and fatigue that has been worsening.  He has been having difficulty standing without assistance and had multiple recent falls.  He admitted to cough without significant dyspnea or wheezing.  MD assessment includes: GI bleeding, sepsis due to PNA, hypokalemia, pancytopenia, AKI, and elevated LFT's.     Clinical Impressions Pt was seen for OT evaluation this date. Pt is AxOx 3 (person, place, time however unclear on situation).  Prior to hospital admission, pt was Mod I with ADLs and ambulated without DME (reports having multiple falls recently). Pt lives in a 2 level home alone with 1st floor set-up and 3 STE, reports not utilizing any DME at PLOF for ambulation, has access to a walk-in shower with shower seat and grab bars available. Pt presents with deficits in activity tolerance, decreased functional flexibility required to access LEs in sitting, and decreased functional balance during tasks that required UE engagement, affecting safe and optimal ADL completion. Pt currently requires assistance with LB ADLs, ADL transfers, and functional mobility directly related to ADLs.  Pt would benefit from skilled OT services to address noted impairments and functional limitations (see below for any additional details) in order to maximize safety and independence while minimizing falls risk and caregiver burden. Anticipate the need for follow up OT services upon acute hospital DC.      If plan is discharge home, recommend the following:   A little help with walking and/or  transfers;A little help with bathing/dressing/bathroom;Assist for transportation;Assistance with cooking/housework;Direct supervision/assist for medications management     Functional Status Assessment   Patient has had a recent decline in their functional status and demonstrates the ability to make significant improvements in function in a reasonable and predictable amount of time.     Equipment Recommendations   BSC/3in1     Recommendations for Other Services         Precautions/Restrictions   Precautions Precautions: Fall Restrictions Weight Bearing Restrictions Per Provider Order: No     Mobility Bed Mobility Overal bed mobility: Modified Independent             General bed mobility comments: with HOB elevated and with use of bed rail    Transfers Overall transfer level: Needs assistance Equipment used: Rolling walker (2 wheels) Transfers: Sit to/from Stand Sit to Stand: Min assist                  Balance Overall balance assessment: Needs assistance, History of Falls   Sitting balance-Leahy Scale: Good     Standing balance support: Bilateral upper extremity supported, During functional activity Standing balance-Leahy Scale: Good                             ADL either performed or assessed with clinical judgement   ADL Overall ADL's : Needs assistance/impaired     Grooming: Wash/dry face;Sitting;Supervision/safety Grooming Details (indicate cue type and reason): EOB             Lower Body Dressing: Sit to/from stand;Minimal assistance;Contact guard assist Lower  Body Dressing Details (indicate cue type and reason): Mod A to access LEs while seated and CGA to Min A for sit to stand transfer at [employer] level Toilet Transfer: Minimal assistance;Rolling walker (2 wheels) Toilet Transfer Details (indicate cue type and reason): simulated Toileting- Clothing Manipulation and Hygiene: Moderate assistance;Sit to/from stand Toileting -  Clothing Manipulation Details (indicate cue type and reason): simulated     Functional mobility during ADLs: Minimal assistance General ADL Comments: Pt able to side step at RW level with Min A to CGA.  Requires increased time to initiate all tasks     Vision Baseline Vision/History: 1 Wears glasses Additional Comments: Pt reports that he typically wears glasses however they were not brought to hospital     Perception         Praxis         Pertinent Vitals/Pain Pain Assessment Pain Assessment: Faces Faces Pain Scale: Hurts little more Facial Expression: Grimacing Body Movements: Restlessness     Extremity/Trunk Assessment Upper Extremity Assessment Upper Extremity Assessment: Generalized weakness   Lower Extremity Assessment Lower Extremity Assessment: Generalized weakness       Communication Communication Communication: No apparent difficulties   Cognition Arousal: Alert Behavior During Therapy: WFL for tasks assessed/performed                                 Following commands: Intact       Cueing  General Comments   Cueing Techniques: Verbal cues      Exercises Other Exercises Other Exercises: Education on role of OT, education on use of BUEs for functional transfers and engagement in standing components of ADLs.   Shoulder Instructions      Home Living Family/patient expects to be discharged to:: Private residence Living Arrangements: Alone Available Help at Discharge: Family;Available 24 hours/day Type of Home: House Home Access: Stairs to enter [employer] of Steps: 3 Entrance Stairs-Rails: Right;Left;Can reach both Home Layout: One level Alternate Level Stairs-Number of Steps: Pt reports that he lives in a 2 level house but reports that his bed/bath are on 1st floor   Bathroom Shower/Tub: Chief Strategy Officer: Handicapped height Bathroom Accessibility: Yes How Accessible: Accessible via walker Home  Equipment: Shower seat - built Char[employer] fundraiser (2 wheels)   Additional Comments: Armrests over the toilet      Prior Functioning/Environment Prior Level of Function : Independent/Modified Independent;History of Falls (last six months)             Mobility Comments: Ind amb community distances without an AD, 10 falls in the last 6 months secondary to LOB ADLs Comments: Ind with ADLs    OT Problem List: Decreased strength;Decreased activity tolerance;Decreased safety awareness;Decreased knowledge of use of DME or AE   OT Treatment/Interventions: Self-care/ADL training;Therapeutic exercise;Therapeutic activities;DME and/or AE instruction;Patient/family education;Balance training      OT Goals(Current goals can be found in the care plan section)   Acute Rehab OT Goals Patient Stated Goal: To get well OT Goal Formulation: With patient Time For Goal Achievement: 06/03/24 Potential to Achieve Goals: Good ADL Goals Pt Will Perform Grooming: with modified independence;standing Pt Will Perform Lower Body Bathing: with supervision;sit to/from stand Pt Will Perform Lower Body Dressing: with modified independence;sit to/from stand Pt Will Transfer to Toilet: with modified independence;bedside commode;stand pivot transfer Pt Will Perform Toileting - Clothing Manipulation and hygiene: with modified independence;sit to/from stand   OT Frequency:  Min 2X/week    Co-evaluation              AM-PAC OT 6 Clicks Daily Activity     Outcome Measure Help from another person eating meals?: None Help from another person taking care of personal grooming?: None Help from another person toileting, which includes using toliet, bedpan, or urinal?: A Little Help from another person bathing (including washing, rinsing, drying)?: A Little Help from another person to put on and taking off regular upper body clothing?: None Help from another person to put on and taking off regular lower body  clothing?: A Little 6 Click Score: 21   End of Session Equipment Utilized During Treatment: Gait belt;Rolling walker (2 wheels)  Activity Tolerance: Patient tolerated treatment well Patient left: in bed;with call bell/phone within reach;with bed alarm set (bed alarm attempted to be set multiple times with a failure to set notice.  NT notified of this.)  OT Visit Diagnosis: Unsteadiness on feet (R26.81);Muscle weakness (generalized) (M62.81);History of falling (Z91.81)                Time: 8961-8879 OT Time Calculation (min): 42 min Charges:  OT General Charges $OT Visit: 1 Visit OT Evaluation $OT Eval Low Complexity: 1 Low OT Treatments $Self Care/Home Management : 38-52 mins  [name] OTR/L   [name] 05/20/2024, 12:03 PM

## 2024-05-21 DIAGNOSIS — K922 Gastrointestinal hemorrhage, unspecified: Secondary | ICD-10-CM | POA: Diagnosis not present

## 2024-05-21 DIAGNOSIS — D500 Iron deficiency anemia secondary to blood loss (chronic): Secondary | ICD-10-CM

## 2024-05-21 DIAGNOSIS — E878 Other disorders of electrolyte and fluid balance, not elsewhere classified: Secondary | ICD-10-CM | POA: Diagnosis not present

## 2024-05-21 DIAGNOSIS — J189 Pneumonia, unspecified organism: Secondary | ICD-10-CM | POA: Diagnosis not present

## 2024-05-21 DIAGNOSIS — D61818 Other pancytopenia: Secondary | ICD-10-CM | POA: Diagnosis not present

## 2024-05-21 DIAGNOSIS — E876 Hypokalemia: Secondary | ICD-10-CM | POA: Diagnosis not present

## 2024-05-21 DIAGNOSIS — N179 Acute kidney failure, unspecified: Secondary | ICD-10-CM | POA: Diagnosis not present

## 2024-05-21 LAB — CBC WITH DIFFERENTIAL/PLATELET
Abs Immature Granulocytes: 0.06 K/uL (ref 0.00–0.07)
Basophils Absolute: 0 K/uL (ref 0.0–0.1)
Basophils Relative: 0 %
Eosinophils Absolute: 0 K/uL (ref 0.0–0.5)
Eosinophils Relative: 0 %
HCT: 28.3 % — ABNORMAL LOW (ref 39.0–52.0)
Hemoglobin: 10 g/dL — ABNORMAL LOW (ref 13.0–17.0)
Immature Granulocytes: 3 %
Lymphocytes Relative: 22 %
Lymphs Abs: 0.5 K/uL — ABNORMAL LOW (ref 0.7–4.0)
MCH: 37.6 pg — ABNORMAL HIGH (ref 26.0–34.0)
MCHC: 35.3 g/dL (ref 30.0–36.0)
MCV: 106.4 fL — ABNORMAL HIGH (ref 80.0–100.0)
Monocytes Absolute: 0.1 K/uL (ref 0.1–1.0)
Monocytes Relative: 6 %
Neutro Abs: 1.6 K/uL — ABNORMAL LOW (ref 1.7–7.7)
Neutrophils Relative %: 69 %
Platelets: 72 K/uL — ABNORMAL LOW (ref 150–400)
RBC: 2.66 MIL/uL — ABNORMAL LOW (ref 4.22–5.81)
RDW: 16.4 % — ABNORMAL HIGH (ref 11.5–15.5)
WBC: 2.3 K/uL — ABNORMAL LOW (ref 4.0–10.5)
nRBC: 2.6 % — ABNORMAL HIGH (ref 0.0–0.2)

## 2024-05-21 LAB — COMPREHENSIVE METABOLIC PANEL WITH GFR
ALT: 89 U/L — ABNORMAL HIGH (ref 0–44)
AST: 44 U/L — ABNORMAL HIGH (ref 15–41)
Albumin: 2.5 g/dL — ABNORMAL LOW (ref 3.5–5.0)
Alkaline Phosphatase: 76 U/L (ref 38–126)
Anion gap: 10 (ref 5–15)
BUN: <5 mg/dL — ABNORMAL LOW (ref 8–23)
CO2: 23 mmol/L (ref 22–32)
Calcium: 7.3 mg/dL — ABNORMAL LOW (ref 8.9–10.3)
Chloride: 104 mmol/L (ref 98–111)
Creatinine, Ser: 0.5 mg/dL — ABNORMAL LOW (ref 0.61–1.24)
GFR, Estimated: >60 mL/min (ref >60)
Glucose, Bld: 108 mg/dL — ABNORMAL HIGH (ref 70–99)
Potassium: 3.4 mmol/L — ABNORMAL LOW (ref 3.5–5.1)
Sodium: 137 mmol/L (ref 135–145)
Total Bilirubin: 1.1 mg/dL (ref 0.0–1.2)
Total Protein: 5 g/dL — ABNORMAL LOW (ref 6.5–8.1)

## 2024-05-21 LAB — PHOSPHORUS
Phosphorus: 1.1 mg/dL — ABNORMAL LOW (ref 2.5–4.6)
Phosphorus: 1.6 mg/dL — ABNORMAL LOW (ref 2.5–4.6)

## 2024-05-21 LAB — GLUCOSE, CAPILLARY
Glucose-Capillary: 115 mg/dL — ABNORMAL HIGH (ref 70–99)
Glucose-Capillary: 167 mg/dL — ABNORMAL HIGH (ref 70–99)
Glucose-Capillary: 129 mg/dL — ABNORMAL HIGH (ref 70–99)
Glucose-Capillary: 122 mg/dL — ABNORMAL HIGH (ref 70–99)

## 2024-05-21 LAB — POTASSIUM: Potassium: 3.7 mmol/L (ref 3.5–5.1)

## 2024-05-21 LAB — MAGNESIUM: Magnesium: 1.9 mg/dL (ref 1.7–2.4)

## 2024-05-21 MED ORDER — POTASSIUM PHOSPHATES 15 MMOLE/5ML IV SOLN
25.0000 mmol | Freq: Once | INTRAVENOUS | Status: AC
  Administered 2024-05-22 (×2): 25 mmol via INTRAVENOUS
  Filled 2024-05-21: qty 8.33

## 2024-05-21 MED ORDER — POTASSIUM PHOSPHATES 15 MMOLE/5ML IV SOLN
45.0000 mmol | Freq: Once | INTRAVENOUS | Status: AC
  Administered 2024-05-21 (×2): 45 mmol via INTRAVENOUS
  Filled 2024-05-21: qty 15

## 2024-05-21 MED ORDER — LOSARTAN POTASSIUM 50 MG PO TABS
50.0000 mg | ORAL_TABLET | Freq: Every day | ORAL | Status: DC
  Administered 2024-05-21 – 2024-05-22 (×4): 50 mg via ORAL
  Filled 2024-05-21 (×2): qty 1

## 2024-05-21 MED ORDER — POTASSIUM CHLORIDE CRYS ER 20 MEQ PO TBCR
20.0000 meq | EXTENDED_RELEASE_TABLET | Freq: Once | ORAL | Status: AC
  Administered 2024-05-21 (×2): 20 meq via ORAL
  Filled 2024-05-21: qty 1

## 2024-05-21 MED ORDER — MAGNESIUM SULFATE 2 GM/50ML IV SOLN
2.0000 g | Freq: Once | INTRAVENOUS | Status: AC
  Administered 2024-05-21 (×2): 2 g via INTRAVENOUS
  Filled 2024-05-21: qty 50

## 2024-05-21 MED ORDER — ENSURE PLUS HIGH PROTEIN PO LIQD
237.0000 mL | Freq: Two times a day (BID) | ORAL | Status: DC
  Administered 2024-05-21 – 2024-05-22 (×6): 237 mL via ORAL

## 2024-05-21 MED ORDER — HYDROCHLOROTHIAZIDE 12.5 MG PO TABS
12.5000 mg | ORAL_TABLET | Freq: Every day | ORAL | Status: DC
  Administered 2024-05-21 – 2024-05-22 (×4): 12.5 mg via ORAL
  Filled 2024-05-21 (×2): qty 1

## 2024-05-21 NOTE — Progress Notes (Signed)
 Occupational Therapy Treatment Patient Details Name: David Christensen MRN: [id number] DOB: 1951/09/26 Today's Date: 05/21/2024   History of present illness Pt is a 73 y.o. caucasian male with medical history significant for GERD, depression, type 2 DM, HTN and PTSD, who presented to the ER with acute onset of diarrhea and recent melena with no BRBPR.  He has been having mild left lower quadrant lower abdominal pain and generalized weakness and fatigue that has been worsening.  He has been having difficulty standing without assistance and had multiple recent falls.  He admitted to cough without significant dyspnea or wheezing.  MD assessment includes: GI bleeding, sepsis due to PNA, hypokalemia, pancytopenia, AKI, and elevated LFT's.   OT comments  Pt seen for OT treatment on this date. Upon arrival to room pt supine in bed with HOB, agreeable to tx. Pt requires assistance with functional transfers at RW level to transition between surfaces however does demonstrate ability to step pivot with CGA following initial Min A to stand.  Toileting tasks completed at [facility] with pt requiring Max A for posterior perineal care.  Pt tolerated all tasks well however requested to return to bed at end of session even with education on benefits of time spent OOB in recliner. Pt making good progress toward goals, will continue to follow POC. Discharge recommendation remains appropriate.        If plan is discharge home, recommend the following:  A little help with walking and/or transfers;A little help with bathing/dressing/bathroom;Assist for transportation;Assistance with cooking/housework;Direct supervision/assist for medications management   Equipment Recommendations  BSC/3in1    Recommendations for Other Services      Precautions / Restrictions Precautions Precautions: Fall Restrictions Weight Bearing Restrictions Per Provider Order: No Other Position/Activity Restrictions: watch SpO2       Mobility Bed  Mobility Overal bed mobility: Modified Independent             General bed mobility comments: with HOB elevated and with use of bed rail    Transfers Overall transfer level: Needs assistance Equipment used: Rolling walker (2 wheels) Transfers: Sit to/from Stand Sit to Stand: Min assist                 Balance Overall balance assessment: Needs assistance, History of Falls   Sitting balance-Leahy Scale: Good     Standing balance support: Bilateral upper extremity supported, During functional activity Standing balance-Leahy Scale: Good                             ADL either performed or assessed with clinical judgement   ADL Overall ADL's : Needs assistance/impaired                         Toilet Transfer: Minimal assistance;Rolling walker (2 wheels) Toilet Transfer Details (indicate cue type and reason): Min A to complete sit to stand and CGA at RW level to transition to/from [facility]. Toileting- Clothing Manipulation and Hygiene: Sit to/from stand;Maximal assistance Toileting - Clothing Manipulation Details (indicate cue type and reason): Max A for posterior perineal hygiene            Extremity/Trunk Assessment Upper Extremity Assessment Upper Extremity Assessment: Generalized weakness            Vision       Perception     Praxis     Communication Communication Communication: No apparent difficulties   Cognition Arousal: Alert Behavior During  Therapy: WFL for tasks assessed/performed                                 Following commands: Intact        Cueing   Cueing Techniques: Verbal cues  Exercises Other Exercises Other Exercises: Education on use of BUEs for functional transfers and engagement in standing components of ADLs.    Shoulder Instructions       General Comments      Pertinent Vitals/ Pain       Pain Assessment Pain Assessment: 0-10 Pain Score: 4  Faces Pain Scale: Hurts little  more Facial Expression: Grimacing Pain Location: abdomen Pain Descriptors / Indicators: Sore, Sharp Pain Intervention(s): Limited activity within patient's tolerance, Monitored during session  Home Living                                          Prior Functioning/Environment              Frequency  Min 2X/week        Progress Toward Goals  OT Goals(current goals can now be found in the care plan section)  Progress towards OT goals: Progressing toward goals     Plan      Co-evaluation                 AM-PAC OT 6 Clicks Daily Activity     Outcome Measure   Help from another person eating meals?: None Help from another person taking care of personal grooming?: None Help from another person toileting, which includes using toliet, bedpan, or urinal?: A Little Help from another person bathing (including washing, rinsing, drying)?: A Little Help from another person to put on and taking off regular upper body clothing?: None Help from another person to put on and taking off regular lower body clothing?: A Little 6 Click Score: 21    End of Session Equipment Utilized During Treatment: Gait belt;Rolling walker (2 wheels)  OT Visit Diagnosis: Unsteadiness on feet (R26.81);Muscle weakness (generalized) (M62.81);History of falling (Z91.81)   Activity Tolerance Patient tolerated treatment well   Patient Left in bed;with call bell/phone within reach;with bed alarm set   Nurse Communication Mobility status        Time: 0926-1000 OT Time Calculation (min): 34 min  Charges: OT General Charges $OT Visit: 1 Visit OT Treatments $Self Care/Home Management : 23-37 mins  [name] OTR/L   [name] 05/21/2024, 11:18 AM

## 2024-05-21 NOTE — Progress Notes (Signed)
 Progress Note   Patient: David Christensen FMW:[id number] DOB: 27-Feb-1951 DOA: 05/17/2024     4 DOS: the patient was seen and examined on 05/21/2024   Brief hospital course: Taken from H&P.  David Christensen is a 73 y.o. Caucasian male with medical history significant for GERD, depression, hypertension and PTSD, who presented to the emergency room with diarrhea over the last several weeks and recent melena with no bright red bleeding per rectum.  He has been having mild left lower quadrant lower abdominal pain and generalized weakness and fatigue.He has been having difficulty standing without assistance and had multiple falls over the past few days including today.David Christensen   On presentation patient had mild tachycardia and tachypnea, labs with mild hyponatremia at 133, severe hypokalemia with potassium less than 2, CO2 13, BUN 59, creatinine 2.27, calcium  8.8 and anion gap of 27.  Elevated liver enzymes with AST 516, ALT 473, T. bili 1.9, lactic acid 2.1>> 1.7, leukocytosis 13.6, macrocytosis. Type and screen was done with blood group of [place] and negative antibody screen.  EKG with sinus tachycardia.  Imaging which include bilateral feet and elbow was negative for any acute fractures. CT abdomen and pelvis with concern of left lower lobe groundglass opacity/consolidation, suggestive of infection/inflammation.  Also noted second portion of duodenal diverticuli and chronic diverticulosis with no diverticulitis.  Patient was started on Rocephin  and Zithromax  along with IV Protonix . Electrolyte replacement ordered.  GI was consulted for concern of GI bleed.  8/9: Remained tachycardic, labs with mild leukopenia 2.4, hemoglobin 11.1, MCV 103 and platelets of 103.  Sodium 134, potassium 2.3, CO2 13, BUN 55 and creatinine with small improvement 1.87.  Improving transaminitis.  Preliminary blood cultures negative in 12-hour.  Replating more potassium. Ordered GI pathogen panel and COVID test.  8/10; vitals stable, labs  with significant hypophosphatemia listed as less than 1, potassium 3.5, replating electrolyte, seems like developing pancytopenia with WBC 1.6, platelet 81 and hemoglobin 10.1-C. difficile antigen positive and negative toxin and PCR, GI pathogen panel negative, significant macrocytosis-anemia panel consistent with anemia of chronic disease, pending B12.  Persistent transaminitis-ordered hepatitis panel.  Blood cultures remain negative. Pending PT and OT evaluation.  8/11: Hemodynamically stable, diarrhea improved.  Persistent but slowly improving hypophosphatemia, borderline magnesium  and hypokalemia-electrolytes are being repleted.  B12 normal at 794.  Hepatitis panel negative.  PT is recommending home health. 8/12: Hemodynamically stable with persistent hypophosphatemia-likely refeeding syndrome.  Aggressively replating electrolyte.  Assessment and Plan: * GI bleeding Per patient he was having some melena, no NSAID use. Hemoglobin with some decreased to 10.1 this morning, it was 13.6 on admission,  GI was consulted-patient likely need EGD and colonoscopy and they would like to correct electrolyte abnormalities before proceeding. Anemia panel consistent with anemia of chronic disease, B12 at 794 - Monitor hemoglobin -Transfuse if below 7  Sepsis due to pneumonia (HCC) Sepsis is manifested by tachypnea and tachycardia. Chest imaging concerning for pneumonia, preliminary blood cultures negative. -Continue with ceftriaxone  and Zithromax  -Continue with supportive care  Hypokalemia Hypophosphatemia Likely due to GI losses, severe hypokalemia on presentation, now also concern of refeeding syndrome with persistent hypophosphatemia.  Potassium at 3.4,And phosphorous of 1.1 today -Replete electrolytes aggressively and monitor  Pancytopenia (HCC) Now seems stable, likely due to some underlying viral illness. Diarrhea improving, C. difficile and GI pathogen panel negative, likely some viral  illness. - Monitor CBC  AKI (acute kidney injury) (HCC) Resolved.  Renal function now at baseline This is likely prerenal due  to volume depletion and dehydration. Renal ultrasound was negative for any acute abnormality, did show bilateral simple renal cyst with no follow-up recommendations.   -Monitor renal function -Avoid nephrotoxins  Elevated LFTs This could be related to significant volume depletion and hepatorenal syndrome. -Hepatitis panel negative Slowly improving and T. bili has been normalized -Monitor liver function -Holding statin  Essential hypertension Blood pressure now started trending up -Restarting home HCTZ and losartan  -Continue with metoprolol  -Monitor blood pressure closely  Depression Will continue Zoloft  at and Seroquel  as well as trazodone .  Dyslipidemia - Will hold off statin therapy given significantly elevated LFTs.   Subjective: Patient was sitting in chair when seen today, no new concern, diarrhea has improved, continue to feel weak.  Physical Exam: Vitals:   05/21/24 0400 05/21/24 0730 05/21/24 0734 05/21/24 1050  BP: 124/63 138/73  (!) 158/65  Pulse: 64 85  97  Resp: 20 17  18   Temp: 98.1 F (36.7 C) 98.6 F (37 C)  97.6 F (36.4 C)  TempSrc: Oral     SpO2: 93% 95% 93% 92%  Weight:      Height:       General.  Frail elderly man, in no acute distress. Pulmonary.  Lungs clear bilaterally, normal respiratory effort. CV.  Regular rate and rhythm, no JVD, rub or murmur. Abdomen.  Soft, nontender, nondistended, BS positive. CNS.  Alert and oriented .  No focal neurologic deficit. Extremities.  No edema, no cyanosis, pulses intact and symmetrical. Psychiatry.  Judgment and insight appears normal.   Data Reviewed: Prior data reviewed.  Family Communication: Talked with daughter on phone.  Disposition: Status is: Inpatient Remains inpatient appropriate because: Severity of illness  Planned Discharge Destination: Home with Home  Health  Time spent: 50 minutes  This record has been created using Conservation officer, historic buildings. Errors have been sought and corrected,but may not always be located. Such creation errors do not reflect on the standard of care.   Author: Amaryllis Dare, MD 05/21/2024 2:40 PM  For on call review [email or web address].

## 2024-05-21 NOTE — Consult Note (Signed)
 PHARMACY CONSULT NOTE - ELECTROLYTES  Pharmacy Consult for Electrolyte Monitoring and Replacement   Recent Labs: Potassium (mmol/L)  Date Value  05/21/2024 3.7  05/01/2012 3.5   Magnesium  (mg/dL)  Date Value  91/87/7974 1.9  04/30/2012 1.8   Calcium  (mg/dL)  Date Value  91/87/7974 7.3 (L)   Calcium , Total (mg/dL)  Date Value  92/76/7986 8.9   Albumin  (g/dL)  Date Value  91/87/7974 2.5 (L)  04/30/2012 3.5   Phosphorus (mg/dL)  Date Value  91/87/7974 1.6 (L)   Sodium (mmol/L)  Date Value  05/21/2024 137  05/01/2012 142   Height: 5' 7 (170.2 cm) Weight: 78.9 kg (173 lb 15.1 oz) IBW/kg (Calculated) : 66.1 Estimated Creatinine Clearance: 74.6 mL/min (A) (by C-G formula based on SCr of 0.5 mg/dL (L)).  Assessment  David Christensen is a 73 y.o. male presenting with GIB, AKI, hypokalemia. PMH significant for GERD, depression, type 2 diabetes mellitus, hypertension and PTSD. Pharmacy has been consulted to monitor and replace electrolytes.  Diet: carb modified MIVF: NS at 150 cc/hr Pertinent medications: Scheduled Kcl po 20meq daily - HELD on 8/11 until more stable  Goal of Therapy: Electrolytes within normal limits  Plan:  8/12 @ 2148:  K = 3.7                        Phos = 1.6   - will order KPhos 20 mmol IVPB X 1   Continue to follow AM labs.  David Christensen D 05/21/2024 10:48 PM

## 2024-05-21 NOTE — Progress Notes (Signed)
 Physical Therapy Treatment Patient Details Name: David Christensen MRN: [id number] DOB: [birth year] Today's Date: 05/21/2024   History of Present Illness Pt is a 73 y.o. caucasian male with medical history significant for GERD, depression, type 2 DM, HTN and PTSD, who presented to the ER with acute onset of diarrhea and recent melena with no BRBPR.  He has been having mild left lower quadrant lower abdominal pain and generalized weakness and fatigue that has been worsening.  He has been having difficulty standing without assistance and had multiple recent falls.  He admitted to cough without significant dyspnea or wheezing.  MD assessment includes: GI bleeding, sepsis due to PNA, hypokalemia, pancytopenia, AKI, and elevated LFT's.    PT Comments  Pt required some encouragement and education on physiological benefits of activity and of benefits of time OOB to chair in order to participate this session but ultimately put forth good effort throughout.  Pt required no physical assistance with below functional tasks but did require extra time and effort with bed mobility tasks and cues for sequencing for hand placement with transfers.  Pt's gait was slow but steady with short Bil step length and flexed trunk posture with SpO2 ranging from 88-89% on 3LO2/min with the portable tank and 2.5L on the wall.  Pt will benefit from continued PT services upon discharge to safely address deficits listed in patient problem list for decreased caregiver assistance and eventual return to PLOF.      If plan is discharge home, recommend the following: A little help with walking and/or transfers;A little help with bathing/dressing/bathroom;Assistance with cooking/housework;Help with stairs or ramp for entrance;Assist for transportation   Can travel by private vehicle        Equipment Recommendations  None recommended by PT    Recommendations for Other Services       Precautions / Restrictions Precautions Precautions:  Fall Restrictions Weight Bearing Restrictions Per Provider Order: No Other Position/Activity Restrictions: watch SpO2     Mobility  Bed Mobility Overal bed mobility: Modified Independent             General bed mobility comments: with HOB elevated and with use of bed rail; extra time and effort required    Transfers Overall transfer level: Needs assistance Equipment used: Rolling walker (2 wheels) Transfers: Sit to/from Stand Sit to Stand: Contact guard assist, From elevated surface           General transfer comment: Good eccentric and concentric control and stability from multiple height surfaces with min to mod verbal cues for hand placement    Ambulation/Gait Ambulation/Gait assistance: Contact guard assist Gait Distance (Feet): 30 Feet Assistive device: Rolling walker (2 wheels) Gait Pattern/deviations: Step-through pattern, Decreased step length - right, Decreased step length - left, Trunk flexed Gait velocity: decreased     General Gait Details: Slow cadence but steady with no overt LOB; SpO2 88-89% during the session on 2.5LO2/min, nsg in room and aware   Stairs             Wheelchair Mobility     Tilt Bed    Modified Rankin (Stroke Patients Only)       Balance Overall balance assessment: Needs assistance, History of Falls   Sitting balance-Leahy Scale: Normal     Standing balance support: Bilateral upper extremity supported, During functional activity Standing balance-Leahy Scale: Good  Communication Communication Communication: No apparent difficulties  Cognition Arousal: Alert Behavior During Therapy: WFL for tasks assessed/performed   PT - Cognitive impairments: No apparent impairments                         Following commands: Intact      Cueing Cueing Techniques: Verbal cues  Exercises Other Exercises Other Exercises: Sit to/from stands x 2 from EOB and x 3 from  recliner with cues for hand placement Other Exercises: Pt education provided on physiological benefits of activity and time OOB to chair    General Comments        Pertinent Vitals/Pain Pain Assessment Pain Assessment: No/denies pain    Home Living                          Prior Function            PT Goals (current goals can now be found in the care plan section) Progress towards PT goals: Progressing toward goals    Frequency    Min 2X/week      PT Plan      Co-evaluation              AM-PAC PT 6 Clicks Mobility   Outcome Measure  Help needed turning from your back to your side while in a flat bed without using bedrails?: None Help needed moving from lying on your back to sitting on the side of a flat bed without using bedrails?: None Help needed moving to and from a bed to a chair (including a wheelchair)?: A Little Help needed standing up from a chair using your arms (e.g., wheelchair or bedside chair)?: A Little Help needed to walk in hospital room?: A Little Help needed climbing 3-5 steps with a railing? : A Lot 6 Click Score: 19    End of Session Equipment Utilized During Treatment: Gait belt;Oxygen Activity Tolerance: Patient tolerated treatment well Patient left: in chair;with call bell/phone within reach;with chair alarm set Nurse Communication: Mobility status (SpO2 88-89% on 2.5L) PT Visit Diagnosis: Unsteadiness on feet (R26.81);History of falling (Z91.81);Difficulty in walking, not elsewhere classified (R26.2);Muscle weakness (generalized) (M62.81);Pain Pain - part of body:  (abdominal pain)     Time: 8981-8954 PT Time Calculation (min) (ACUTE ONLY): 27 min  Charges:    $Gait Training: 8-22 mins $Therapeutic Activity: 8-22 mins PT General Charges $$ ACUTE PT VISIT: 1 Visit                     D. Scott Kai Calico PT, DPT 05/21/24, 11:38 AM

## 2024-05-21 NOTE — TOC Initial Note (Signed)
 Transition of Care [facility]) - Initial/Assessment Note    Patient Details  Name: David Christensen MRN: [id number] Date of Birth: 03-27-1951  [facility] CM/SW Contact:    Lauraine JAYSON Carpen, LCSW Phone Number: 05/21/2024, 11:26 AM  Clinical Narrative:   Readmission prevention screen complete. CSW met with patient. No family at bedside. CSW introduced role and explained that discharge planning would be discussed. PCP is Damien Davies, FNP. Patient drives himself to appointments. Pharmacy is [facility]. No issues obtaining medications. Patient lives home alone but his daughter is going to come stay with him. No home health prior to admission. He is agreeable to home health recommendation. No agency preference. [facility] accepted for PT, OT, RN. No DME use prior to admission. He is agreeable to 3-in-1. Will order closer to discharge. Patient confirmed he does not use oxygen at home. He is currently on 3 L. Will follow for this potential discharge need as well. No further concerns. CSW will continue to follow patient for support and facilitate return home once stable. His daughter will transport him home at discharge.              Expected Discharge Plan: Home w Home Health Services Barriers to Discharge: Continued Medical Work up   Patient Goals and CMS Choice   CMS Medicare.gov Compare Post Acute Care list provided to:: Patient        Expected Discharge Plan and Services     Post Acute Care Choice: Home Health, Durable Medical Equipment Living arrangements for the past 2 months: Single Family Home                           HH Arranged: RN, PT, OT [facility] Agency: [facility] Date [facility] Agency Contacted: 05/21/24   Representative spoke with at [facility] Agency: [place]   Prior Living Arrangements/Services Living arrangements for the past 2 months: Single Family Home Lives with:: Self Patient language and need for interpreter reviewed:: Yes Do you feel safe going back to the  place where you live?: Yes      Need for Family Participation in Patient Care: Yes (Comment) Care giver support system in place?: Yes (comment)   Criminal Activity/Legal Involvement Pertinent to Current Situation/Hospitalization: No - Comment as needed  Activities of Daily Living      Permission Sought/Granted Permission sought to share information with : Facility Industrial/product designer granted to share information with : Yes, Verbal Permission Granted     Permission granted to share info w AGENCY: Home health agencies        Emotional Assessment Appearance:: Appears stated age Attitude/Demeanor/Rapport: Engaged, Gracious Affect (typically observed): Accepting, Appropriate, Calm, Pleasant Orientation: : Oriented to Self, Oriented to Place, Oriented to  Time, Oriented to Situation Alcohol / Substance Use: Not Applicable Psych Involvement: No (comment)  Admission diagnosis:  GI bleeding [K92.2] Patient Active Problem List   Diagnosis Date Noted   Melena 05/20/2024   Pancytopenia (HCC) 05/19/2024   GI bleeding 05/17/2024   Elevated LFTs 05/17/2024   AKI (acute kidney injury) (HCC) 05/17/2024   Sepsis due to pneumonia (HCC) 05/17/2024   Essential hypertension 05/17/2024   Hypokalemia 05/17/2024   Dyslipidemia 05/17/2024   Depression 05/17/2024   Cholecystitis 02/27/2015   Abdominal pain 02/26/2015   PCP:  Pablo Damien, FNP Pharmacy:   [facility], [place] - [address] [place] [address] Phone: [phone] Fax: [phone]  Social Drivers of Health (SDOH) Social History: SDOH Screenings   Food Insecurity: No Food Insecurity (05/18/2024)  Recent Concern: Food Insecurity - Food Insecurity Present (03/13/2024)   Received from [facility]  Housing: Low Risk  (05/18/2024)  Transportation Needs: Unmet Transportation Needs (05/18/2024)  Utilities: Not At Risk (05/18/2024)  Financial Resource Strain: Medium Risk (03/13/2024)    Received from [facility]  Physical Activity: Inactive (11/09/2023)   Received from [facility]  Social Connections: Socially Isolated (05/18/2024)  Stress: Stress Concern Present (10/12/2021)   Received from [facility]  Tobacco Use: High Risk (05/17/2024)  Health Literacy: Low Risk  (10/12/2021)   [facility] from [facility]   SDOH Interventions:     Readmission Risk Interventions    05/21/2024   11:23 AM  Readmission Risk Prevention Plan  Transportation Screening Complete  PCP or Specialist Appt within 3-5 Days Complete  HRI or Home Care Consult Complete  Social Work Consult for Recovery Care Planning/Counseling Complete  Palliative Care Screening Not Applicable  Medication Review Oceanographer) Complete

## 2024-05-21 NOTE — Progress Notes (Signed)
 Rogelia Copping, MD [facility]   [address], [address] [place], [place] [address] Phone: [phone] Fax : [phone]   Subjective: The patient has had no further signs of any GI bleeding.  The patient has pancytopenia which would explain his anemia.  The patient has multiple other medical problems including respiratory issues and electrolyte abnormalities.   Objective: Vital signs in last 24 hours: Vitals:   05/21/24 0400 05/21/24 0730 05/21/24 0734 05/21/24 1050  BP: 124/63 138/73  (!) 158/65  Pulse: 64 85  97  Resp: 20 17  18   Temp: 98.1 F (36.7 C) 98.6 F (37 C)  97.6 F (36.4 C)  TempSrc: Oral     SpO2: 93% 95% 93% 92%  Weight:      Height:       Weight change:   Intake/Output Summary (Last 24 hours) at 05/21/2024 1128 Last data filed at 05/21/2024 0900 Gross per 24 hour  Intake 751.97 ml  Output 850 ml  Net -98.03 ml     Exam: Heart:: Regular rate and rhythm or without murmur or extra heart sounds Lungs: expiratory rhonchi Abdomen: soft, nontender, normal bowel sounds   Lab Results: @LABTEST2 @ Micro Results: Recent Results (from the past 240 hours)  Culture, blood (x 2)     Status: None (Preliminary result)   Collection Time: 05/18/24 12:07 AM   Specimen: BLOOD  Result Value Ref Range Status   Specimen Description BLOOD BLOOD LEFT HAND  Final   Special Requests   Final    BOTTLES DRAWN AEROBIC AND ANAEROBIC Blood Culture results may not be optimal due to an inadequate volume of blood received in culture bottles   Culture   Final    NO GROWTH 2 DAYS Performed at [facility], [address], [place], [place] [address]    Report Status PENDING  Incomplete  Culture, blood (x 2)     Status: None (Preliminary result)   Collection Time: 05/18/24 12:16 AM   Specimen: BLOOD  Result Value Ref Range Status   Specimen Description BLOOD BLOOD RIGHT HAND  Final   Special Requests   Final    BOTTLES DRAWN AEROBIC ONLY Blood Culture results may not be  optimal due to an inadequate volume of blood received in culture bottles   Culture   Final    NO GROWTH 2 DAYS Performed at [facility], [address] [place], [place] [address]    Report Status PENDING  Incomplete  Resp panel by RT-PCR (RSV, Flu A&B, Covid) Anterior Nasal Swab     Status: None   Collection Time: 05/18/24  1:00 PM   Specimen: Anterior Nasal Swab  Result Value Ref Range Status   SARS Coronavirus 2 by RT PCR NEGATIVE NEGATIVE Final    Comment: (NOTE) SARS-CoV-2 target nucleic acids are NOT DETECTED.  The SARS-CoV-2 RNA is generally detectable in upper respiratory specimens during the acute phase of infection. The lowest concentration of SARS-CoV-2 viral copies this assay can detect is 138 copies/mL. A negative result does not preclude SARS-Cov-2 infection and should not be used as the sole basis for treatment or other patient management decisions. A negative result may occur with  improper specimen collection/handling, submission of specimen other than nasopharyngeal swab, presence of viral mutation(s) within the areas targeted by this assay, and inadequate number of viral copies(<138 copies/mL). A negative result must be combined with clinical observations, patient history, and epidemiological information. The expected result is Negative.  Fact Sheet for Patients:  BloggerCourse.com  Fact Sheet for Healthcare Providers:  SeriousBroker.it  This test is no t yet approved or cleared by the [place]  FDA and  has been authorized for detection and/or diagnosis of SARS-CoV-2 by FDA under an Emergency Use Authorization (EUA). This EUA will remain  in effect (meaning this test can be used) for the duration of the COVID-19 declaration under Section 564(b)(1) of the Act, 21 U.S.C.section 360bbb-3(b)(1), unless the authorization is terminated  or revoked sooner.       Influenza A by PCR NEGATIVE NEGATIVE  Final   Influenza B by PCR NEGATIVE NEGATIVE Final    Comment: (NOTE) The Xpert Xpress SARS-CoV-2/FLU/RSV plus assay is intended as an aid in the diagnosis of influenza from Nasopharyngeal swab specimens and should not be used as a sole basis for treatment. Nasal washings and aspirates are unacceptable for Xpert Xpress SARS-CoV-2/FLU/RSV testing.  Fact Sheet for Patients: BloggerCourse.com  Fact Sheet for Healthcare Providers: SeriousBroker.it  This test is not yet approved or cleared by the [place]  FDA and has been authorized for detection and/or diagnosis of SARS-CoV-2 by FDA under an Emergency Use Authorization (EUA). This EUA will remain in effect (meaning this test can be used) for the duration of the COVID-19 declaration under Section 564(b)(1) of the Act, 21 U.S.C. section 360bbb-3(b)(1), unless the authorization is terminated or revoked.     Resp Syncytial Virus by PCR NEGATIVE NEGATIVE Final    Comment: (NOTE) Fact Sheet for Patients: BloggerCourse.com  Fact Sheet for Healthcare Providers: SeriousBroker.it  This test is not yet approved or cleared by the [place]  FDA and has been authorized for detection and/or diagnosis of SARS-CoV-2 by FDA under an Emergency Use Authorization (EUA). This EUA will remain in effect (meaning this test can be used) for the duration of the COVID-19 declaration under Section 564(b)(1) of the Act, 21 U.S.C. section 360bbb-3(b)(1), unless the authorization is terminated or revoked.  Performed at [facility], [address] [place], [place] [address]   Gastrointestinal Panel by PCR , Stool     Status: None   Collection Time: 05/18/24  4:59 PM   Specimen: Stool  Result Value Ref Range Status   Campylobacter species NOT DETECTED NOT DETECTED Final   Plesimonas shigelloides NOT DETECTED NOT DETECTED Final    Salmonella species NOT DETECTED NOT DETECTED Final   Yersinia enterocolitica NOT DETECTED NOT DETECTED Final   Vibrio species NOT DETECTED NOT DETECTED Final   Vibrio cholerae NOT DETECTED NOT DETECTED Final   Enteroaggregative E coli (EAEC) NOT DETECTED NOT DETECTED Final   Enteropathogenic E coli (EPEC) NOT DETECTED NOT DETECTED Final   Enterotoxigenic E coli (ETEC) NOT DETECTED NOT DETECTED Final   Shiga like toxin producing E coli (STEC) NOT DETECTED NOT DETECTED Final   Shigella/Enteroinvasive E coli (EIEC) NOT DETECTED NOT DETECTED Final   Cryptosporidium NOT DETECTED NOT DETECTED Final   Cyclospora cayetanensis NOT DETECTED NOT DETECTED Final   Entamoeba histolytica NOT DETECTED NOT DETECTED Final   Giardia lamblia NOT DETECTED NOT DETECTED Final   Adenovirus F40/41 NOT DETECTED NOT DETECTED Final   Astrovirus NOT DETECTED NOT DETECTED Final   Norovirus GI/GII NOT DETECTED NOT DETECTED Final   Rotavirus A NOT DETECTED NOT DETECTED Final   Sapovirus (I, II, IV, and V) NOT DETECTED NOT DETECTED Final    Comment: Performed at [facility], [address], [place], [place] [address]  C Difficile Quick Screen w PCR reflex     Status: Abnormal  Collection Time: 05/18/24  4:59 PM   Specimen: STOOL  Result Value Ref Range Status   C Diff antigen POSITIVE (A) NEGATIVE Final   C Diff toxin NEGATIVE NEGATIVE Final   C Diff interpretation Results are indeterminate. See PCR results.  Final    Comment: Performed at [facility], [address] [place], [place] [address]  C. Diff by PCR, Reflexed     Status: None   Collection Time: 05/18/24  4:59 PM  Result Value Ref Range Status   Toxigenic C. Difficile by PCR NEGATIVE NEGATIVE Final    Comment: Patient is colonized with non toxigenic C. difficile. May not need treatment unless significant symptoms are present.   Hypervirulent Strain PRESUMPTIVE NEGATIVE PRESUMPTIVE NEGATIVE Final    Comment: Performed at [facility], [address] [place], [place] [address]  MRSA Next Gen by PCR, Nasal     Status: None   Collection Time: 05/20/24  7:00 AM   Specimen: Nasal Mucosa; Nasal Swab  Result Value Ref Range Status   MRSA by PCR Next Gen NOT DETECTED NOT DETECTED Final    Comment: (NOTE) The GeneXpert MRSA Assay (FDA approved for NASAL specimens only), is one component of a comprehensive MRSA colonization surveillance program. It is not intended to diagnose MRSA infection nor to guide or monitor treatment for MRSA infections. Test performance is not FDA approved in patients less than 69 years old. Performed at [facility] [address] [place], [place] [address]    Studies/Results: No results found. Medications: I have reviewed the patient's current medications. Scheduled Meds:  fluticasone   1 spray Each Nare Daily   gabapentin   100 mg Oral TID   Gerhardt's butt cream   Topical TID   guaiFENesin   600 mg Oral BID   insulin  aspart  0-5 Units Subcutaneous QHS   insulin  aspart  0-9 Units Subcutaneous TID WC   ipratropium-albuterol   3 mL Nebulization BID   metoprolol  tartrate  5 mg Intravenous Once   metoprolol  tartrate  25 mg Oral BID   pantoprazole  (PROTONIX ) IV  40 mg Intravenous Q12H   QUEtiapine   100 mg Oral QHS   Continuous Infusions:  azithromycin  500 mg (05/20/24 2206)   cefTRIAXone  (ROCEPHIN )  IV 2 g (05/20/24 2205)   potassium PHOSPHATE  IVPB (in mmol) 45 mmol (05/21/24 1034)   PRN Meds:.acetaminophen  **OR** acetaminophen , chlorpheniramine-HYDROcodone, levalbuterol , ondansetron  **OR** ondansetron  (ZOFRAN ) IV, traMADol , zolpidem    Assessment: Principal Problem:   GI bleeding Active Problems:   Elevated LFTs   AKI (acute kidney injury) (HCC)   Sepsis due to pneumonia (HCC)   Essential hypertension   Hypokalemia   Dyslipidemia   Depression   Pancytopenia (HCC)   Melena    Plan: This patient was seen for GI bleeding with pancytopenia.  The patient's  hemoglobin has remained stable with 1 level being 11.7 yesterday which was likely a erroneous value/lab error.  The jump from 10.12 days ago to 11.7 without a blood transfusion indicate that this is unlikely a true value.  The patient's hemoglobin today was 10.0 not changed from 2 days ago.  He has has not had any further signs of GI bleeding.  I do not believe pursuing a luminal evaluation at this time is warranted since the patient is no longer bleeding and he has other medical problems including his respiratory issues and electrolyte abnormalities. I will sign off at this time and please contact me if he should show any sign of acute GI  bleeding or significant drop in hemoglobin.  I will sign off.  Please call if any further GI concerns or questions.  We would like to thank you for the opportunity to participate in the care of [facility].    LOS: 4 days   Rogelia Copping, MD.FACG 05/21/2024, 11:28 AM Pager [phone] 7am-5pm  Check AMION for 5pm -7am coverage and on weekends

## 2024-05-21 NOTE — Consult Note (Addendum)
 PHARMACY CONSULT NOTE - ELECTROLYTES  Pharmacy Consult for Electrolyte Monitoring and Replacement   Recent Labs: Potassium (mmol/L)  Date Value  05/21/2024 3.4 (L)  05/01/2012 3.5   Magnesium  (mg/dL)  Date Value  91/87/7974 1.9  04/30/2012 1.8   Calcium  (mg/dL)  Date Value  91/87/7974 7.3 (L)   Calcium , Total (mg/dL)  Date Value  92/76/7986 8.9   Albumin  (g/dL)  Date Value  91/87/7974 2.5 (L)  04/30/2012 3.5   Phosphorus (mg/dL)  Date Value  91/87/7974 1.1 (L)   Sodium (mmol/L)  Date Value  05/21/2024 137  05/01/2012 142   Height: 5' 7 (170.2 cm) Weight: 78.9 kg (173 lb 15.1 oz) IBW/kg (Calculated) : 66.1 Estimated Creatinine Clearance: 74.6 mL/min (A) (by C-G formula based on SCr of 0.5 mg/dL (L)).  Assessment  David Christensen is a 73 y.o. male presenting with GIB, AKI, hypokalemia. PMH significant for GERD, depression, type 2 diabetes mellitus, hypertension and PTSD. Pharmacy has been consulted to monitor and replace electrolytes.  Diet: carb modified MIVF: NS at 150 cc/hr Pertinent medications: Scheduled Kcl po 20meq daily - HELD on 8/11 until more stable  Goal of Therapy: Electrolytes within normal limits  Plan:  K 3.4, Phos 1.1. Will order Kphos 45 mmol IV x 1 dose plus Kcl 20 meq po x 1 dose. Mg 1.9, Will replace with MgSulfate 2gm IV x 1 dose. Check Phos and K 6-10 hours after completing Kphos infusion. Continue to follow AM labs.  David Christensen PharmD, BCPS 05/21/2024 7:28 AM

## 2024-05-22 ENCOUNTER — Other Ambulatory Visit: Payer: Self-pay

## 2024-05-22 DIAGNOSIS — J189 Pneumonia, unspecified organism: Secondary | ICD-10-CM | POA: Diagnosis not present

## 2024-05-22 DIAGNOSIS — D500 Iron deficiency anemia secondary to blood loss (chronic): Secondary | ICD-10-CM

## 2024-05-22 DIAGNOSIS — K922 Gastrointestinal hemorrhage, unspecified: Secondary | ICD-10-CM | POA: Diagnosis not present

## 2024-05-22 DIAGNOSIS — E876 Hypokalemia: Secondary | ICD-10-CM | POA: Diagnosis not present

## 2024-05-22 DIAGNOSIS — N179 Acute kidney failure, unspecified: Secondary | ICD-10-CM | POA: Diagnosis not present

## 2024-05-22 LAB — GLUCOSE, CAPILLARY
Glucose-Capillary: 113 mg/dL — ABNORMAL HIGH (ref 70–99)
Glucose-Capillary: 146 mg/dL — ABNORMAL HIGH (ref 70–99)

## 2024-05-22 LAB — BASIC METABOLIC PANEL WITH GFR
Anion gap: 6 (ref 5–15)
BUN: <5 mg/dL — ABNORMAL LOW (ref 8–23)
CO2: 25 mmol/L (ref 22–32)
Calcium: 7.6 mg/dL — ABNORMAL LOW (ref 8.9–10.3)
Chloride: 105 mmol/L (ref 98–111)
Creatinine, Ser: 0.37 mg/dL — ABNORMAL LOW (ref 0.61–1.24)
GFR, Estimated: >60 mL/min (ref >60)
Glucose, Bld: 126 mg/dL — ABNORMAL HIGH (ref 70–99)
Potassium: 3.6 mmol/L (ref 3.5–5.1)
Sodium: 136 mmol/L (ref 135–145)

## 2024-05-22 LAB — CBC
HCT: 27.5 % — ABNORMAL LOW (ref 39.0–52.0)
Hemoglobin: 9.7 g/dL — ABNORMAL LOW (ref 13.0–17.0)
MCH: 38.3 pg — ABNORMAL HIGH (ref 26.0–34.0)
MCHC: 35.3 g/dL (ref 30.0–36.0)
MCV: 108.7 fL — ABNORMAL HIGH (ref 80.0–100.0)
Platelets: 67 K/uL — ABNORMAL LOW (ref 150–400)
RBC: 2.53 MIL/uL — ABNORMAL LOW (ref 4.22–5.81)
RDW: 16.5 % — ABNORMAL HIGH (ref 11.5–15.5)
WBC: 2.9 K/uL — ABNORMAL LOW (ref 4.0–10.5)
nRBC: 0.7 % — ABNORMAL HIGH (ref 0.0–0.2)

## 2024-05-22 LAB — MAGNESIUM: Magnesium: 1.9 mg/dL (ref 1.7–2.4)

## 2024-05-22 LAB — PHOSPHORUS
Phosphorus: 2.4 mg/dL — ABNORMAL LOW (ref 2.5–4.6)
Phosphorus: 2.5 mg/dL (ref 2.5–4.6)

## 2024-05-22 LAB — POTASSIUM: Potassium: 4.1 mmol/L (ref 3.5–5.1)

## 2024-05-22 MED ORDER — MELOXICAM 7.5 MG PO TABS: 7.5000 mg | ORAL_TABLET | Freq: Every day | ORAL | Status: AC | PRN

## 2024-05-22 MED ORDER — GERHARDT'S BUTT CREAM
1.0000 | TOPICAL_CREAM | Freq: Three times a day (TID) | CUTANEOUS | 0 refills | Status: AC
  Filled 2024-05-22 (×2): qty 45, 15d supply, fill #0

## 2024-05-22 MED ORDER — BENZONATATE 100 MG PO CAPS
100.0000 mg | ORAL_CAPSULE | Freq: Three times a day (TID) | ORAL | 0 refills | Status: AC | PRN
  Filled 2024-05-22: qty 30, 10d supply, fill #0

## 2024-05-22 MED ORDER — TRAMADOL HCL 50 MG PO TABS
50.0000 mg | ORAL_TABLET | Freq: Four times a day (QID) | ORAL | 0 refills | Status: DC | PRN
  Filled 2024-05-22: qty 30, 8d supply, fill #0

## 2024-05-22 MED ORDER — ENSURE PLUS HIGH PROTEIN PO LIQD
237.0000 mL | Freq: Two times a day (BID) | ORAL | 2 refills | Status: AC
  Filled 2024-05-22 (×2): qty 20000, 42d supply, fill #0

## 2024-05-22 MED ORDER — GUAIFENESIN ER 600 MG PO TB12
600.0000 mg | ORAL_TABLET | Freq: Two times a day (BID) | ORAL | 0 refills | Status: AC | PRN
Start: 2024-05-22 — End: ?
  Filled 2024-05-22: qty 30, 15d supply, fill #0

## 2024-05-22 NOTE — Discharge Summary (Signed)
 Physician Discharge Summary   Patient: David Christensen MRN: [id number] DOB: Oct 14, 1950  Admit date:     05/17/2024  Discharge date: 05/22/24  Discharge Physician: Amaryllis Dare   PCP: Pablo Perkins, FNP   Recommendations at discharge:  Please obtain CBC, magnesium , phosphorous and CMP on follow-up Patient need to see a hematologist for further evaluation of pancytopenia Patient will get benefit to see a gastroenterologist to get screening colonoscopy and EGD as he was complaining of intermittent melena.  He should avoid NSAID.  There was no active bleeding during current hospitalization. Follow-up with primary care provider within a week Follow-up with hematology Follow-up with gastroenterology  Discharge Diagnoses: Principal Problem:   GI bleeding Active Problems:   Sepsis due to pneumonia (HCC)   Hypokalemia   AKI (acute kidney injury) (HCC)   Pancytopenia (HCC)   Elevated LFTs   Essential hypertension   Depression   Dyslipidemia   Melena   Iron deficiency anemia due to chronic blood loss   Hospital Course: Taken from H&P.  David Christensen is a 73 y.o. Caucasian male with medical history significant for GERD, depression, hypertension and PTSD, who presented to the emergency room with diarrhea over the last several weeks and recent melena with no bright red bleeding per rectum.  He has been having mild left lower quadrant lower abdominal pain and generalized weakness and fatigue.He has been having difficulty standing without assistance and had multiple falls over the past few days including today.SABRA   On presentation patient had mild tachycardia and tachypnea, labs with mild hyponatremia at 133, severe hypokalemia with potassium less than 2, CO2 13, BUN 59, creatinine 2.27, calcium  8.8 and anion gap of 27.  Elevated liver enzymes with AST 516, ALT 473, T. bili 1.9, lactic acid 2.1>> 1.7, leukocytosis 13.6, macrocytosis. Type and screen was done with blood group of [place] and negative  antibody screen.  EKG with sinus tachycardia.  Imaging which include bilateral feet and elbow was negative for any acute fractures. CT abdomen and pelvis with concern of left lower lobe groundglass opacity/consolidation, suggestive of infection/inflammation.  Also noted second portion of duodenal diverticuli and chronic diverticulosis with no diverticulitis.  Patient was started on Rocephin  and Zithromax  along with IV Protonix . Electrolyte replacement ordered.  GI was consulted for concern of GI bleed.  8/9: Remained tachycardic, labs with mild leukopenia 2.4, hemoglobin 11.1, MCV 103 and platelets of 103.  Sodium 134, potassium 2.3, CO2 13, BUN 55 and creatinine with small improvement 1.87.  Improving transaminitis.  Preliminary blood cultures negative in 12-hour.  Replating more potassium. Ordered GI pathogen panel and COVID test.  8/10; vitals stable, labs with significant hypophosphatemia listed as less than 1, potassium 3.5, replating electrolyte, seems like developing pancytopenia with WBC 1.6, platelet 81 and hemoglobin 10.1-C. difficile antigen positive and negative toxin and PCR, GI pathogen panel negative, significant macrocytosis-anemia panel consistent with anemia of chronic disease, pending B12.  Persistent transaminitis-ordered hepatitis panel.  Blood cultures remain negative. Pending PT and OT evaluation.  8/11: Hemodynamically stable, diarrhea improved.  Persistent but slowly improving hypophosphatemia, borderline magnesium  and hypokalemia-electrolytes are being repleted.  B12 normal at 794.  Hepatitis panel negative.  PT is recommending home health. 8/12: Hemodynamically stable with persistent hypophosphatemia-likely refeeding syndrome.  Aggressively replating electrolyte.  8/13: Remained hemodynamically stable, continue to require 2 to 3 L of oxygen especially with ambulation.  Completed course of antibiotic for pneumonia.  Electrolytes were repleted and within normal  limit.  Patient wants to go  home, he is being discharged home with some home health.  Patient was instructed to avoid NSAID and was given tramadol  to use as needed for pain.  Patient will get benefit from outpatient gastroenterology evaluation as there was no active GI bleeding during hospitalization.  Patient also has pancytopenia with slight worsening of thrombocytopenia.  He was given referral for outpatient hematology for further evaluation.  Patient would Benefit from a repeat chest imaging in 4 to 6 weeks to see the resolution of current abnormalities.  He will continue on current medications and need to have a close follow-up with his providers for further assistance.  Assessment and Plan: * GI bleeding Per patient he was having some melena, no NSAID use. Hemoglobin with some decreased to 10.1 this morning, it was 13.6 on admission,  GI was consulted-patient likely need EGD and colonoscopy and they would like to correct electrolyte abnormalities before proceeding. Anemia panel consistent with anemia of chronic disease, B12 at 794 - Monitor hemoglobin -Transfuse if below 7  Sepsis due to pneumonia (HCC) Sepsis is manifested by tachypnea and tachycardia. Chest imaging concerning for pneumonia, preliminary blood cultures negative. - Completed a course of ceftriaxone  and Zithromax  -Continue with supportive care  Hypokalemia Hypophosphatemia Likely due to GI losses, severe hypokalemia on presentation, now also concern of refeeding syndrome with persistent hypophosphatemia.   Electrolytes were repleted before discharge and they were within normal limits.  Will need close monitoring  Pancytopenia (HCC) Now seems stable, likely due to some underlying viral illness. Diarrhea improving, C. difficile and GI pathogen panel negative, likely some viral illness. - Monitor CBC - Referral to see hematology as outpatient was provided for further evaluation  AKI (acute kidney injury)  (HCC) Resolved.  Renal function now at baseline This is likely prerenal due to volume depletion and dehydration. Renal ultrasound was negative for any acute abnormality, did show bilateral simple renal cyst with no follow-up recommendations.   -Monitor renal function -Avoid nephrotoxins  Elevated LFTs This could be related to significant volume depletion and hepatorenal syndrome. -Hepatitis panel negative Slowly improving and T. bili has been normalized -Monitor liver function - Will resume statin on discharge  Essential hypertension Blood pressure now started trending up -Continue home medications -Monitor blood pressure closely  Depression Will continue Zoloft  at and Seroquel  as well as trazodone .  Dyslipidemia - Statin held during hospitalization due to elevated liver enzymes, he can resume on discharge.  Pain control - [place]  Controlled Substance Reporting System database was reviewed. and patient was instructed, not to drive, operate heavy machinery, perform activities at heights, swimming or participation in water activities or provide baby-sitting services while on Pain, Sleep and Anxiety Medications; until their outpatient Physician has advised to do so again. Also recommended to not to take more than prescribed Pain, Sleep and Anxiety Medications.  Consultants: None Procedures performed: None  Disposition: Home health Diet recommendation:  Discharge Diet Orders (From admission, onward)     Start     Ordered   05/22/24 0000  Diet - low sodium heart healthy        05/22/24 1257           Carb modified diet DISCHARGE MEDICATION: Allergies as of 05/22/2024       Reactions   Alpha-gal Other (See Comments)   NO RED MEAT; sever stomach upset for up to 7 days   Metformin And Related Diarrhea   Penicillins Diarrhea   Zolpidem  Other (See Comments)   Other reaction(s): OTHER  Medication List     STOP taking these medications    amLODipine  10 MG  tablet Commonly known as: NORVASC    cetirizine 10 MG tablet Commonly known as: ZYRTEC   chlorpheniramine-HYDROcodone 10-8 MG/5ML Suer Commonly known as: Tussionex Pennkinetic  ER   diazepam  5 MG tablet Commonly known as: VALIUM    morphine  15 MG 12 hr tablet Commonly known as: MS CONTIN    omeprazole 20 MG capsule Commonly known as: PRILOSEC   oxyCODONE -acetaminophen  10-325 MG tablet Commonly known as: PERCOCET   oxyCODONE -acetaminophen  5-325 MG tablet Commonly known as: PERCOCET/ROXICET   potassium chloride  SA 20 MEQ tablet Commonly known as: KLOR-CON  M   predniSONE  10 MG (21) Tbpk tablet Commonly known as: STERAPRED UNI-PAK 21 TAB   pregabalin  100 MG capsule Commonly known as: LYRICA    sertraline  100 MG tablet Commonly known as: ZOLOFT    traZODone  100 MG tablet Commonly known as: DESYREL        TAKE these medications    acetaminophen  500 MG tablet Commonly known as: TYLENOL  Take 1,000 mg by mouth daily.   albuterol  108 (90 Base) MCG/ACT inhaler Commonly known as: VENTOLIN  HFA Inhale 2 puffs into the lungs every 4 (four) hours as needed for wheezing or shortness of breath.   aspirin EC 81 MG tablet Take 81 mg by mouth daily.   atorvastatin  10 MG tablet Commonly known as: LIPITOR Take 10 mg by mouth daily.   benzonatate  100 MG capsule Commonly known as: Tessalon  Perles Take 1 capsule (100 mg total) by mouth 3 (three) times daily as needed for cough.   diclofenac Sodium 1 % Gel Commonly known as: VOLTAREN Apply 4 g topically 4 (four) times daily.   EPINEPHrine  0.3 mg/0.3 mL Soaj injection Commonly known as: EPI-PEN Inject 0.3 mg into the muscle as needed for anaphylaxis. INJECT 1 SYRINGE INTO OUTER THIGH ONCE AS NEEDED FOR SEVERE ALLERGIC REACTION.   feeding supplement Liqd Take 237 mLs by mouth 2 (two) times daily between meals.   fluticasone  50 MCG/ACT nasal spray Commonly known as: FLONASE  Place 2 sprays into the nose at bedtime.    gabapentin  100 MG capsule Commonly known as: NEURONTIN  Take 100 mg by mouth 3 (three) times daily.   Gerhardt's butt cream Crea Apply 1 Application topically 3 (three) times daily for 15 days.   guaiFENesin  600 MG 12 hr tablet Commonly known as: MUCINEX  Take 1 tablet (600 mg total) by mouth 2 (two) times daily as needed for to loosen phlegm or cough.   hydrochlorothiazide  12.5 MG tablet Commonly known as: HYDRODIURIL  Take 1 tablet by mouth daily.   lidocaine  5 % ointment Commonly known as: XYLOCAINE  Apply 1 Application topically 3 (three) times daily.   losartan  50 MG tablet Commonly known as: COZAAR  Take 50 mg by mouth daily.   meloxicam  7.5 MG tablet Commonly known as: MOBIC  Take 1 tablet (7.5 mg total) by mouth daily as needed for pain. What changed:  when to take this reasons to take this   metoprolol  succinate 25 MG 24 hr tablet Commonly known as: TOPROL -XL Take 25 mg by mouth daily.   naloxone 4 MG/0.1ML Liqd nasal spray kit Commonly known as: NARCAN One spray in either nostril once for known/suspected opioid overdose. May repeat every 2-3 minutes in alternating nostril til EMS arrives   pantoprazole  40 MG tablet Commonly known as: PROTONIX  Take 40 mg by mouth 2 (two) times daily.   prazosin 2 MG capsule Commonly known as: MINIPRESS Take 4 mg by mouth 2 (two) times  daily.   QUEtiapine  100 MG tablet Commonly known as: SEROQUEL  TAKE 2&1/2 TABLETS BY MOUTH AT BEDTIME   senna 8.6 MG Tabs tablet Commonly known as: SENOKOT Take 1 tablet by mouth daily.   tamsulosin  0.4 MG Caps capsule Commonly known as: FLOMAX  Take 1 capsule by mouth daily.   traMADol  50 MG tablet Commonly known as: ULTRAM  Take 1 tablet (50 mg total) by mouth every 6 (six) hours as needed for moderate pain (pain score 4-6) or severe pain (pain score 7-10).   zolpidem  10 MG tablet Commonly known as: AMBIEN  Take 10 mg by mouth at bedtime as needed. What changed: Another medication with  the same name was removed. Continue taking this medication, and follow the directions you see here.               Durable Medical Equipment  (From admission, onward)           Start     Ordered   05/22/24 1158  For home use only DME oxygen  Once       Question Answer Comment  Length of Need 6 Months   Mode or (Route) Nasal cannula   Liters per Minute 3   Frequency Continuous (stationary and portable oxygen unit needed)   Oxygen conserving device Yes   Oxygen delivery system Gas      05/22/24 1157   05/22/24 1153  For home use only DME 3 n 1  Once        05/22/24 1152              Discharge Care Instructions  (From admission, onward)           Start     Ordered   05/22/24 0000  Discharge wound care:       Comments: Cleanse buttocks/rectal area with Vashe wound cleanser Soila [phone]) do not rinse and allow to air dry. Apply Gerhardt's Butt Cream to area 3 times a day and prn soiling.  May sprinkle over Gerhardt's with floor stock antifungal powder (microguard green and white label) for extra drying effect.  Every other day    Comments: Cleanse hand and arm wounds with Vashe, do not rinse.  Apply Xeroform gauze ([place] [phone]) to wound beds every other day and secure with silicone foam or Kerlix roll gauze whichever is preferred   05/22/24 1257            Follow-up Information     [facility].   Specialty: Home Health Services Why: They will follow up with you for your home health needs. Contact information: [address] [place] [address] [phone]         Pablo Perkins, FNP. Schedule an appointment as soon as possible for a visit in 1 week(s).   Specialty: Family Medicine Why: - pleasw have patient book appointment. Contact information: [address] [place] [address] [phone]                Discharge Exam: Fredricka Weights   05/18/24 1529  Weight: 78.9 kg    General.  Frail elderly man, In no acute distress. Pulmonary.  Lungs clear bilaterally, normal respiratory effort. CV.  Regular rate and rhythm, no JVD, rub or murmur. Abdomen.  Soft, nontender, nondistended, BS positive. CNS.  Alert and oriented .  No focal neurologic deficit. Extremities.  No edema, no cyanosis, pulses intact and symmetrical. Psychiatry.  Judgment and insight appears normal.  Condition at discharge: stable  The results of significant diagnostics from this hospitalization (including imaging, microbiology, ancillary and laboratory) are listed below for reference.   Imaging Studies: US  RENAL Result Date: 05/18/2024 EXAM: RETROPERITONEAL ULTRASOUND OF THE KIDNEYS AND URINARY BLADDER TECHNIQUE: Real-time ultrasonography of the retroperitoneum including the kidneys and urinary bladder was performed. COMPARISON: None CLINICAL HISTORY: 409830 AKI (acute kidney injury) (HCC) 409830. AKI (acute kidney injury) (HCC) FINDINGS: RIGHT KIDNEY: The right kidney measures 10.6 x 5.9 x 5.7 cm. A 10 mm simple cyst is present at the lower pole of the right kidney. Right kidney volume is estimated at 186.5 ml. The right kidney demonstrates normal cortical echogenicity. No hydronephrosis or intrarenal stones. LEFT KIDNEY: The left kidney measures 10.4 x 6.5 x 5.8 cm. Estimated volume is 203.1 ml. A simple cyst at the lower pole of the left kidney measures 2.0 cm maximally. The left kidney demonstrates normal cortical echogenicity. No hydronephrosis or intrarenal stones. BLADDER: Unremarkable appearance of the bladder. IMPRESSION: 1. No acute findings. 2. Bilateral simple renal cysts. Recommend no imaging follow up for these studies. Electronically signed by: Lonni Necessary MD 05/18/2024 11:22 AM EDT RP Workstation: [id number]   CT ABDOMEN PELVIS WO CONTRAST Result Date: 05/17/2024 CLINICAL DATA:  LLQ abdominal pain hemoccult positive diarrhea x 3 weeks EXAM: CT ABDOMEN AND PELVIS WITHOUT  CONTRAST TECHNIQUE: Multidetector CT imaging of the abdomen and pelvis was performed following the standard protocol without IV contrast. RADIATION DOSE REDUCTION: This exam was performed according to the departmental dose-optimization program which includes automated exposure control, adjustment of the mA and/or kV according to patient size and/or use of iterative reconstruction technique. COMPARISON:  CT angio chest 02/13/2020 FINDINGS: Lower chest: Left lower lobe ground glass and consolidation airspace opacities. Right lower lobe bronchiole wall thickening. Cardiac leads. Hepatobiliary: No focal liver abnormality. Status post cholecystectomy. No biliary dilatation. Pancreas: No focal lesion. Normal pancreatic contour. No surrounding inflammatory changes. No main pancreatic ductal dilatation. Spleen: Normal in size without focal abnormality. Adrenals/Urinary Tract: Bilateral adrenal gland nodule hyperplasia. 1.1 cm right fluid density adrenal gland nodule likely representing an adenoma-no further follow-up indicated. Bilateral kidneys enhance symmetrically. Fluid density and hemorrhagic/proteinaceous cysts- in the absence of clinically indicated signs/symptoms, require no independent follow-up. No hydronephrosis. No hydroureter.  No nephroureterolithiasis. The urinary bladder is unremarkable. Stomach/Bowel: Stomach is within normal limits. Second portion of the duodenum diverticula no evidence of bowel wall thickening or dilatation. Colonic diverticulosis. Appendix appears normal. Vascular/Lymphatic: No abdominal aorta or iliac aneurysm. Severe atherosclerotic plaque of the aorta and its branches. No abdominal, pelvic, or inguinal lymphadenopathy. Reproductive: No mass. Prostate not visualized and likely surgically removed. Other: No intraperitoneal free fluid. No intraperitoneal free gas. No organized fluid collection. Musculoskeletal: No abdominal wall hernia or abnormality. No suspicious lytic or blastic  osseous lesions. No acute displaced fracture. L5-S1 degenerative changes. IMPRESSION: 1. Left lower lobe ground glass and consolidation airspace opacities. Findings suggestive of infection/inflammation. 2. Second portion of the duodenum diverticula and colonic diverticulosis with no acute diverticulitis. 3.  Aortic Atherosclerosis (ICD10-I70.0). 4. Limited evaluation on this noncontrast study. Electronically Signed   By: Morgane  Naveau M.D.   On: 05/17/2024 20:59   DG Elbow 2 Views Left Result Date: 05/17/2024 CLINICAL DATA:  fall w/ BL foot and elbow pain EXAM: LEFT ELBOW - 2 VIEW COMPARISON:  None Available. FINDINGS: Right elbow No acute fracture or dislocation. While the lateral radiograph is suboptimal due to patient positioning, no large elbow joint effusion is visualized. Soft  tissues are unremarkable. Left elbow No acute fracture or dislocation. While the lateral radiograph is suboptimal due to patient positioning, no large elbow joint effusion is visualized. Soft tissues are unremarkable. IMPRESSION: Both lateral radiographs are suboptimal due to patient positioning, which limits evaluation for a nondisplaced elbow fracture. No large elbow joint effusion, acute, displaced fracture, or dislocation in either elbow. If concern persists for elbow joint fracture, repeat imaging with improved positioning on the lateral radiographs would be recommended. Electronically Signed   By: Rogelia Myers M.D.   On: 05/17/2024 18:01   DG Elbow 2 Views Right Result Date: 05/17/2024 CLINICAL DATA:  fall w/ BL foot and elbow pain EXAM: RIGHT ELBOW - 2 VIEW COMPARISON:  None Available. FINDINGS: Right elbow No acute fracture or dislocation. While the lateral radiograph is suboptimal due to patient positioning, no large elbow joint effusion is visualized. Soft tissues are unremarkable. Left elbow No acute fracture or dislocation. While the lateral radiograph is suboptimal due to patient positioning, no large elbow joint  effusion is visualized. Soft tissues are unremarkable. IMPRESSION: Both lateral radiographs are suboptimal due to patient positioning, which limits evaluation for a nondisplaced elbow fracture. No large elbow joint effusion, acute, displaced fracture, or dislocation in either elbow. If concern persists for elbow joint fracture, repeat imaging with improved positioning on the lateral radiographs would be recommended. Electronically Signed   By: Rogelia Myers M.D.   On: 05/17/2024 18:00   DG Foot Complete Left Result Date: 05/17/2024 CLINICAL DATA:  fall w/ BL foot and elbow pain EXAM: LEFT FOOT - COMPLETE 3+ VIEW COMPARISON:  None Available. FINDINGS: No acute fracture or dislocation. Small undersurface calcaneal heel spur. Small Achilles insertion enthesophyte. Soft tissues are unremarkable. No radiopaque foreign body. IMPRESSION: No acute fracture or dislocation. Electronically Signed   By: Rogelia Myers M.D.   On: 05/17/2024 17:57   DG Foot Complete Right Result Date: 05/17/2024 CLINICAL DATA:  fall w/ BL foot and elbow pain EXAM: RIGHT FOOT COMPLETE - 3+ VIEW COMPARISON:  None Available. FINDINGS: No acute fracture or dislocation. There is no evidence of arthropathy or other focal bone abnormality. Soft tissues are unremarkable. No radiopaque foreign body. IMPRESSION: No acute fracture or dislocation. Electronically Signed   By: Rogelia Myers M.D.   On: 05/17/2024 17:56   CT Head Wo Contrast Result Date: 05/14/2024 EXAM: CT HEAD AND CERVICAL SPINE 05/14/2024 01:17:01 AM TECHNIQUE: CT of the head and cervical spine was performed without the administration of intravenous contrast. Multiplanar reformatted images are provided for review. Automated exposure control, iterative reconstruction, and/or weight based adjustment of the mA/kV was utilized to reduce the radiation dose to as low as reasonably achievable. COMPARISON: None available. CLINICAL HISTORY: Headache, no red flags. FINDINGS: CT HEAD BRAIN  AND VENTRICLES: No acute intracranial hemorrhage. No mass effect or midline shift. No abnormal extra-axial fluid collection. Gray-white differentiation is maintained. No hydrocephalus. Mild chronic ischemic white matter changes. ORBITS: No acute abnormality. SINUSES AND MASTOIDS: No acute abnormality. SOFT TISSUES AND SKULL: No acute skull fracture. No acute soft tissue abnormality. CT CERVICAL SPINE BONES AND ALIGNMENT: No acute fracture or traumatic malalignment. DEGENERATIVE CHANGES: No significant degenerative changes. SOFT TISSUES: No prevertebral soft tissue swelling. IMPRESSION: 1. No acute intracranial abnormality. 2. Mild chronic ischemic white matter changes. Electronically signed by: Franky Stanford MD 05/14/2024 01:26 AM EDT RP Workstation: [id number]   CT Cervical Spine Wo Contrast Result Date: 05/14/2024 EXAM: CT HEAD AND CERVICAL SPINE 05/14/2024 01:17:01 AM TECHNIQUE: CT of  the head and cervical spine was performed without the administration of intravenous contrast. Multiplanar reformatted images are provided for review. Automated exposure control, iterative reconstruction, and/or weight based adjustment of the mA/kV was utilized to reduce the radiation dose to as low as reasonably achievable. COMPARISON: None available. CLINICAL HISTORY: Headache, no red flags. FINDINGS: CT HEAD BRAIN AND VENTRICLES: No acute intracranial hemorrhage. No mass effect or midline shift. No abnormal extra-axial fluid collection. Gray-white differentiation is maintained. No hydrocephalus. Mild chronic ischemic white matter changes. ORBITS: No acute abnormality. SINUSES AND MASTOIDS: No acute abnormality. SOFT TISSUES AND SKULL: No acute skull fracture. No acute soft tissue abnormality. CT CERVICAL SPINE BONES AND ALIGNMENT: No acute fracture or traumatic malalignment. DEGENERATIVE CHANGES: No significant degenerative changes. SOFT TISSUES: No prevertebral soft tissue swelling. IMPRESSION: 1. No acute intracranial  abnormality. 2. Mild chronic ischemic white matter changes. Electronically signed by: Franky Stanford MD 05/14/2024 01:26 AM EDT RP Workstation: [id number]    Microbiology: Results for orders placed or performed during the hospital encounter of 05/17/24  Culture, blood (x 2)     Status: None (Preliminary result)   Collection Time: 05/18/24 12:07 AM   Specimen: BLOOD  Result Value Ref Range Status   Specimen Description BLOOD BLOOD LEFT HAND  Final   Special Requests   Final    BOTTLES DRAWN AEROBIC AND ANAEROBIC Blood Culture results may not be optimal due to an inadequate volume of blood received in culture bottles   Culture   Final    NO GROWTH 4 DAYS Performed at [facility], [address], [place], [place] [address]    Report Status PENDING  Incomplete  Culture, blood (x 2)     Status: None (Preliminary result)   Collection Time: 05/18/24 12:16 AM   Specimen: BLOOD  Result Value Ref Range Status   Specimen Description BLOOD BLOOD RIGHT HAND  Final   Special Requests   Final    BOTTLES DRAWN AEROBIC ONLY Blood Culture results may not be optimal due to an inadequate volume of blood received in culture bottles   Culture   Final    NO GROWTH 4 DAYS Performed at [facility], [address] [place], [place] [address]    Report Status PENDING  Incomplete  Resp panel by RT-PCR (RSV, Flu A&B, Covid) Anterior Nasal Swab     Status: None   Collection Time: 05/18/24  1:00 PM   Specimen: Anterior Nasal Swab  Result Value Ref Range Status   SARS Coronavirus 2 by RT PCR NEGATIVE NEGATIVE Final    Comment: (NOTE) SARS-CoV-2 target nucleic acids are NOT DETECTED.  The SARS-CoV-2 RNA is generally detectable in upper respiratory specimens during the acute phase of infection. The lowest concentration of SARS-CoV-2 viral copies this assay can detect is 138 copies/mL. A negative result does not preclude SARS-Cov-2 infection and should not be used as the sole basis for  treatment or other patient management decisions. A negative result may occur with  improper specimen collection/handling, submission of specimen other than nasopharyngeal swab, presence of viral mutation(s) within the areas targeted by this assay, and inadequate number of viral copies(<138 copies/mL). A negative result must be combined with clinical observations, patient history, and epidemiological information. The expected result is Negative.  Fact Sheet for Patients:  BloggerCourse.com  Fact Sheet for Healthcare Providers:  SeriousBroker.it  This test is no t yet approved or cleared by the [place]  FDA and  has been authorized for detection and/or diagnosis of  SARS-CoV-2 by FDA under an Emergency Use Authorization (EUA). This EUA will remain  in effect (meaning this test can be used) for the duration of the COVID-19 declaration under Section 564(b)(1) of the Act, 21 U.S.C.section 360bbb-3(b)(1), unless the authorization is terminated  or revoked sooner.       Influenza A by PCR NEGATIVE NEGATIVE Final   Influenza B by PCR NEGATIVE NEGATIVE Final    Comment: (NOTE) The Xpert Xpress SARS-CoV-2/FLU/RSV plus assay is intended as an aid in the diagnosis of influenza from Nasopharyngeal swab specimens and should not be used as a sole basis for treatment. Nasal washings and aspirates are unacceptable for Xpert Xpress SARS-CoV-2/FLU/RSV testing.  Fact Sheet for Patients: BloggerCourse.com  Fact Sheet for Healthcare Providers: SeriousBroker.it  This test is not yet approved or cleared by the [place]  FDA and has been authorized for detection and/or diagnosis of SARS-CoV-2 by FDA under an Emergency Use Authorization (EUA). This EUA will remain in effect (meaning this test can be used) for the duration of the COVID-19 declaration under Section 564(b)(1) of the Act, 21  U.S.C. section 360bbb-3(b)(1), unless the authorization is terminated or revoked.     Resp Syncytial Virus by PCR NEGATIVE NEGATIVE Final    Comment: (NOTE) Fact Sheet for Patients: BloggerCourse.com  Fact Sheet for Healthcare Providers: SeriousBroker.it  This test is not yet approved or cleared by the [place]  FDA and has been authorized for detection and/or diagnosis of SARS-CoV-2 by FDA under an Emergency Use Authorization (EUA). This EUA will remain in effect (meaning this test can be used) for the duration of the COVID-19 declaration under Section 564(b)(1) of the Act, 21 U.S.C. section 360bbb-3(b)(1), unless the authorization is terminated or revoked.  Performed at [facility], [address] [place], [place] [address]   Gastrointestinal Panel by PCR , Stool     Status: None   Collection Time: 05/18/24  4:59 PM   Specimen: Stool  Result Value Ref Range Status   Campylobacter species NOT DETECTED NOT DETECTED Final   Plesimonas shigelloides NOT DETECTED NOT DETECTED Final   Salmonella species NOT DETECTED NOT DETECTED Final   Yersinia enterocolitica NOT DETECTED NOT DETECTED Final   Vibrio species NOT DETECTED NOT DETECTED Final   Vibrio cholerae NOT DETECTED NOT DETECTED Final   Enteroaggregative E coli (EAEC) NOT DETECTED NOT DETECTED Final   Enteropathogenic E coli (EPEC) NOT DETECTED NOT DETECTED Final   Enterotoxigenic E coli (ETEC) NOT DETECTED NOT DETECTED Final   Shiga like toxin producing E coli (STEC) NOT DETECTED NOT DETECTED Final   Shigella/Enteroinvasive E coli (EIEC) NOT DETECTED NOT DETECTED Final   Cryptosporidium NOT DETECTED NOT DETECTED Final   Cyclospora cayetanensis NOT DETECTED NOT DETECTED Final   Entamoeba histolytica NOT DETECTED NOT DETECTED Final   Giardia lamblia NOT DETECTED NOT DETECTED Final   Adenovirus F40/41 NOT DETECTED NOT DETECTED Final   Astrovirus NOT DETECTED NOT  DETECTED Final   Norovirus GI/GII NOT DETECTED NOT DETECTED Final   Rotavirus A NOT DETECTED NOT DETECTED Final   Sapovirus (I, II, IV, and V) NOT DETECTED NOT DETECTED Final    Comment: Performed at [facility], [address] [place], [place] [address]  C Difficile Quick Screen w PCR reflex     Status: Abnormal   Collection Time: 05/18/24  4:59 PM   Specimen: STOOL  Result Value Ref Range Status   C Diff antigen POSITIVE (A) NEGATIVE Final   C Diff toxin NEGATIVE NEGATIVE  Final   C Diff interpretation Results are indeterminate. See PCR results.  Final    Comment: Performed at [facility], [address] [place], [place] [address]  C. Diff by PCR, Reflexed     Status: None   Collection Time: 05/18/24  4:59 PM  Result Value Ref Range Status   Toxigenic C. Difficile by PCR NEGATIVE NEGATIVE Final    Comment: Patient is colonized with non toxigenic C. difficile. May not need treatment unless significant symptoms are present.   Hypervirulent Strain PRESUMPTIVE NEGATIVE PRESUMPTIVE NEGATIVE Final    Comment: Performed at [facility], [address] [place], [place] [address]  MRSA Next Gen by PCR, Nasal     Status: None   Collection Time: 05/20/24  7:00 AM   Specimen: Nasal Mucosa; Nasal Swab  Result Value Ref Range Status   MRSA by PCR Next Gen NOT DETECTED NOT DETECTED Final    Comment: (NOTE) The GeneXpert MRSA Assay (FDA approved for NASAL specimens only), is one component of a comprehensive MRSA colonization surveillance program. It is not intended to diagnose MRSA infection nor to guide or monitor treatment for MRSA infections. Test performance is not FDA approved in patients less than 25 years old. Performed at [facility], [address] [place], [place] [address]     Labs: CBC: Recent Labs  Lab 05/17/24 1707 05/18/24 0007 05/18/24 0511 05/18/24 1300 05/19/24 0453 05/20/24 0602 05/21/24 0406 05/22/24 0437  WBC 7.3 5.5  2.4*  --  1.6* 1.8* 2.3* 2.9*  NEUTROABS 6.2 4.5  --   --   --   --  1.6*  --   HGB 13.6 12.8* 11.1* 10.8* 10.1* 11.7* 10.0* 9.7*  HCT 37.5* 35.3* 30.6* 29.4* 27.9* 32.2* 28.3* 27.5*  MCV 101.6* 103.5* 103.0*  --  104.1* 104.9* 106.4* 108.7*  PLT 184 140* 103*  --  81* 71* 72* 67*   Basic Metabolic Panel: Recent Labs  Lab 05/18/24 0007 05/18/24 0508 05/19/24 0002 05/19/24 0453 05/19/24 1758 05/20/24 0602 05/21/24 0406 05/21/24 2148 05/22/24 0437 05/22/24 1154  NA 135   < > 135 137  --  143 137  --  136  --   K 2.2*   < > 3.0* 3.5   < > 3.3* 3.4* 3.7 3.6 4.1  CL 97*   < > 107 108  --  111 104  --  105  --   CO2 13*   < > 14* 15*  --  22 23  --  25  --   GLUCOSE 130*   < > 176* 134*  --  134* 108*  --  126*  --   BUN 58*   < > 25* 20  --  7* <5*  --  <5*  --   CREATININE 1.84*   < > 1.21 0.79  --  0.60* 0.50*  --  0.37*  --   CALCIUM  8.5*   < > 7.5* 7.5*  --  7.6* 7.3*  --  7.6*  --   MG 2.8*  --   --  2.3  --  1.8 1.9  --  1.9  --   PHOS  --    < >  --  <1.0*   < > 1.2* 1.1* 1.6* 2.4* 2.5   < > = values in this interval not displayed.   Liver Function Tests: Recent Labs  Lab 05/18/24 0007 05/18/24 0508 05/19/24 0002 05/19/24 0453 05/21/24 0406  AST 405* 267*  124* 107* 44*  ALT 363* 286* 195* 184* 89*  ALKPHOS 98 78 89 88 76  BILITOT 2.0* 2.1* 1.8* 1.6* 1.1  PROT 5.7* 4.8* 5.3* 5.4* 5.0*  ALBUMIN  3.3* 2.7* 2.9* 2.9* 2.5*   CBG: Recent Labs  Lab 05/21/24 1147 05/21/24 1638 05/21/24 2141 05/22/24 0750 05/22/24 1257  GLUCAP 167* 129* 122* 113* 146*    Discharge time spent: greater than 30 minutes.  This record has been created using Conservation officer, historic buildings. Errors have been sought and corrected,but may not always be located. Such creation errors do not reflect on the standard of care.   Signed: Amaryllis Dare, MD [facility] 05/22/2024

## 2024-05-22 NOTE — TOC Transition Note (Signed)
 Transition of Care [facility]) - Discharge Note   Patient Details  Name: David Christensen MRN: [id number] Date of Birth: Oct 02, 1951  Transition of Care [facility]) CM/SW Contact:  Lauraine JAYSON Carpen, LCSW Phone Number: 05/22/2024, 1:00 PM   Clinical Narrative: Patient has orders to discharge home today. [facility] liaison is aware. No further concerns. CSW signing off.    Final next level of care: Home w Home Health Services Barriers to Discharge: Barriers Resolved   Patient Goals and CMS Choice   CMS Medicare.gov Compare Post Acute Care list provided to:: Patient Choice offered to / list presented to : Patient      Discharge Placement                Patient to be transferred to facility by: Daughter   Patient and family notified of of transfer: 05/22/24  Discharge Plan and Services Additional resources added to the After Visit Summary for       Post Acute Care Choice: Home Health, Durable Medical Equipment          DME Arranged: 3-N-1, Oxygen DME Agency: [employer] Date DME Agency Contacted: 05/22/24   Representative spoke with at DME Agency: [place] HH Arranged: PT, OT HH Agency: [facility] Date [facility] Agency Contacted: 05/22/24   Representative spoke with at [facility] Agency: [place]   Social Drivers of Health (SDOH) Interventions SDOH Screenings   Food Insecurity: No Food Insecurity (05/18/2024)  Recent Concern: Food Insecurity - Food Insecurity Present (03/13/2024)   [facility] from [facility]  Housing: Low Risk  (05/18/2024)  Transportation Needs: Unmet Transportation Needs (05/18/2024)  Utilities: Not At Risk (05/18/2024)  Financial Resource Strain: Medium Risk (03/13/2024)   [facility] from [facility]  Physical Activity: Inactive (11/09/2023)   Received from [facility]  Social Connections: Socially Isolated (05/18/2024)  Stress: Stress Concern Present (10/12/2021)   Received from [facility]  Tobacco Use: High Risk (05/17/2024)  Health Literacy: Low Risk   (10/12/2021)   [facility] from [facility]     Readmission Risk Interventions    05/21/2024   11:23 AM  Readmission Risk Prevention Plan  Transportation Screening Complete  PCP or Specialist Appt within 3-5 Days Complete  HRI or Home Care Consult Complete  Social Work Consult for Recovery Care Planning/Counseling Complete  Palliative Care Screening Not Applicable  Medication Review Oceanographer) Complete

## 2024-05-22 NOTE — Discharge Planning (Signed)
 SATURATION QUALIFICATIONS: (This note is used to comply with regulatory documentation for home oxygen)  Patient Saturations on Room Air at Rest = 89%  Patient Saturations on Room Air while Ambulating = 88%  Patient Saturations on 3 Liters of oxygen while Ambulating = 92%  Please briefly explain why patient needs home oxygen:

## 2024-05-22 NOTE — Progress Notes (Signed)
 Occupational Therapy Treatment Patient Details Name: David Christensen MRN: [id number] DOB: 07/02/1951 Today's Date: 05/22/2024   History of present illness Pt is a 73 y.o. caucasian male with medical history significant for GERD, depression, type 2 DM, HTN and PTSD, who presented to the ER with acute onset of diarrhea and recent melena with no BRBPR.  He has been having mild left lower quadrant lower abdominal pain and generalized weakness and fatigue that has been worsening.  He has been having difficulty standing without assistance and had multiple recent falls.  He admitted to cough without significant dyspnea or wheezing.  MD assessment includes: GI bleeding, sepsis due to PNA, hypokalemia, pancytopenia, AKI, and elevated LFT's.   OT comments  Pt seen for OT treatment on this date. Upon arrival to room pt had just returned to bed with assistance from RN, agreeable to tx as long as it was at bed level, is not longer supplemental O2 dependent. Pt currently with pending discharge home today.  Discussed home safety, fall risk management, role of home health OT services.  Education provided on BUE exercises that pt can complete at bed level/any level to support endurance/activity tolerance with cuing for pace, form, and technique.  Pt making good progress toward goals, will continue to follow POC. Discharge recommendation remains appropriate.        If plan is discharge home, recommend the following:  A little help with walking and/or transfers;A little help with bathing/dressing/bathroom;Assist for transportation;Assistance with cooking/housework;Direct supervision/assist for medications management   Equipment Recommendations  BSC/3in1    Recommendations for Other Services      Precautions / Restrictions Precautions Precautions: Fall Recall of Precautions/Restrictions: Intact Restrictions Weight Bearing Restrictions Per Provider Order: No       Mobility Bed Mobility                     Transfers                         Balance                                           ADL either performed or assessed with clinical judgement   ADL                                              Extremity/Trunk Assessment Upper Extremity Assessment Upper Extremity Assessment: Generalized weakness            Vision       Perception     Praxis     Communication Communication Communication: No apparent difficulties   Cognition Arousal: Alert Behavior During Therapy: WFL for tasks assessed/performed                                 Following commands: Intact        Cueing   Cueing Techniques: Verbal cues  Exercises Other Exercises Other Exercises: Education on role of HHOT services, education on home safety, energy conservation techniques, placement recommendations for BSC in the home setting Other Exercises: Education on BUE AROM/aerobic exercises to support overall conditioning/activity tolerance with cuing for pace, form, and technique.  Shoulder Instructions       General Comments      Pertinent Vitals/ Pain       Pain Assessment Pain Assessment: No/denies pain  Home Living                                          Prior Functioning/Environment              Frequency  Min 2X/week        Progress Toward Goals  OT Goals(current goals can now be found in the care plan section)  Progress towards OT goals: Progressing toward goals     Plan      Co-evaluation                 AM-PAC OT 6 Clicks Daily Activity     Outcome Measure   Help from another person eating meals?: None Help from another person taking care of personal grooming?: None Help from another person toileting, which includes using toliet, bedpan, or urinal?: A Little Help from another person bathing (including washing, rinsing, drying)?: A Little Help from another person to put on and  taking off regular upper body clothing?: None Help from another person to put on and taking off regular lower body clothing?: A Little 6 Click Score: 21    End of Session    OT Visit Diagnosis: Unsteadiness on feet (R26.81);Muscle weakness (generalized) (M62.81);History of falling (Z91.81)   Activity Tolerance Patient tolerated treatment well   Patient Left in bed;with call bell/phone within reach;with bed alarm set   Nurse Communication Mobility status        Time: 8690-8678 OT Time Calculation (min): 12 min  Charges: OT General Charges $OT Visit: 1 Visit OT Treatments $Therapeutic Activity: 8-22 mins  David Christensen OTR/L   David Christensen 05/22/2024, 1:30 PM

## 2024-05-22 NOTE — Discharge Instructions (Signed)
 Transportation Resources for [employer]  Agency Name: [facility] Agency Address: [address] [place], [place] [address] Phone: [phone] Email: [email or web address] Website: [email or web address] Service(s) Offered: Housing services, self-sufficiency, congregate meal program, weatherization program, Field seismologist program, emergency food assistance,  housing counseling, home ownership program, wheels-towork program.  Agency Name: [facility] [employer] [id number]) Address: [address], [place], [place] [address] Phone: [phone] Website: [email or web address] Service(s) Offered: Transportation for [employer], subscription and demand response; Dial-a-Ride for citizens 13 years of age or older.  Agency Name: Department of Social Services Address: [address] [place], [place] [address] Phone: [phone] Service(s) Offered: Child support services; child welfare services; food stamps; Medicaid; work first family assistance; and aid with fuel,  rent, food and medicine, transportation assistance.  Agency Name: Disabled [employer] (DAV) Transportation  Network Phone: [phone] Service(s) Offered: Transports veterans to the [facility] medical center. Call  forty-eight hours in advance and leave the name, telephone  number, date, and time of appointment. Veteran will be  contacted by the driver the day before the appointment to  arrange a pick up point    [employer] ACTA currently provides door to door services. ACTA connects with PART daily for services to [facility]. ACTA also performs contract services to [employer] operates 27 vehicles, all but 3 mini-vans are equipped with lifts for special needs as well as the general public. ACTA drivers are each CDL certified and trained in First Aid and CPR. ACTA was established in 2002 by [employer]. An independent Industrial/product designer. ACTA operates via Cytogeneticist with required local 10% match funding from [place]. ACTA provides over 80,000 passenger trips each year, including [facility] and [employer] sites.  Call at least by 11 AM one business day prior to needing transportation  [employer].                      [place], [place] [address]     Office Hours: Monday-Friday  8 AM - 5 PM   Do you feel isolated?  [facility] offers a [employer] that anyone can call toll free at [phone]. The friendship line is available 24 hours a day  [employer] is a Program of All-inclusive Care for the Elderly (PACE). Their mission is to promote and sustain the independence of seniors wishing to remain in the community. They provide seniors with comprehensive long-term health, social, medical and dietary care. Their program is a safe alternative to nursing home care. [phone]  [facility] Physical Address [facility] [address] [place], [place] [address] Phone: [phone]. . Online zoom yoga class, connect with others without leaving your home [facility] offers [place] dance cardio sessions for individuals via Zoom. This program provides: - Dance fitness activities Please contact program for more information. Servinganyone in need adults 18+ hiv/aids individuals families Call [phone]  Email [email or web address] [email or web address] to get more info  [employer] offers an online [employer] to individuals where they can receive help to focus on their best health. Whether you're a [employer] member or not, the neighborhood center offers a... Main Serviceshealth education  exercise & fitness  community support services  recreation  virtual support Other Servicessupport groups Servinganyone in need adults young adults teens  seniors individuals families [email or web address] to get more info  Schedule  on their website  The [facility] offers an array of activities for adults age 35 and over. This program provides:- Fitness and health programs- Tech classes- Activity books Main Serviceshealth education  community support services  exercise & fitness  recreation  more education Servingseniors  Call [phone]    For more resources go online to [email or web address].co.uk and type in you zipcode

## 2024-05-22 NOTE — TOC Progression Note (Signed)
 Transition of Care [facility]) - Progression Note    Patient Details  Name: David Christensen MRN: [id number] Date of Birth: 1950/12/25  Transition of Care [facility]) CM/SW Contact  Lauraine JAYSON Carpen, LCSW Phone Number: 05/22/2024, 12:04 PM  Clinical Narrative:  CSW updated patient. No DME agency preference. Ordered 3-in-1 and oxygen through [employer].   Expected Discharge Plan: Home w Home Health Services Barriers to Discharge: Continued Medical Work up               Expected Discharge Plan and Services     Post Acute Care Choice: Home Health, Durable Medical Equipment Living arrangements for the past 2 months: Single Family Home                           HH Arranged: RN, PT, OT [facility] Agency: [facility] Date [facility] Agency Contacted: 05/21/24   Representative spoke with at [facility] Agency: [place]    Social Drivers of Health (SDOH) Interventions SDOH Screenings   Food Insecurity: No Food Insecurity (05/18/2024)  Recent Concern: Food Insecurity - Food Insecurity Present (03/13/2024)   Received from [facility]  Housing: Low Risk  (05/18/2024)  Transportation Needs: Unmet Transportation Needs (05/18/2024)  Utilities: Not At Risk (05/18/2024)  Financial Resource Strain: Medium Risk (03/13/2024)   Received from [facility]  Physical Activity: Inactive (11/09/2023)   Received from [facility]  Social Connections: Socially Isolated (05/18/2024)  Stress: Stress Concern Present (10/12/2021)   Received from [facility]  Tobacco Use: High Risk (05/17/2024)  Health Literacy: Low Risk  (10/12/2021)   [facility]    Readmission Risk Interventions    05/21/2024   11:23 AM  Readmission Risk Prevention Plan  Transportation Screening Complete  PCP or Specialist Appt within 3-5 Days Complete  HRI or Home Care Consult Complete  Social Work Consult for Recovery Care Planning/Counseling Complete  Palliative Care Screening Not Applicable  Medication Review Oceanographer)  Complete

## 2024-05-22 NOTE — TOC CM/SW Note (Signed)
 Patient is not able to walk the distance required to go the bathroom, or he/she is unable to safely negotiate stairs required to access the bathroom.  A 3in1 BSC will alleviate this problem

## 2024-05-22 NOTE — Consult Note (Signed)
 PHARMACY CONSULT NOTE - ELECTROLYTES  Pharmacy Consult for Electrolyte Monitoring and Replacement   Recent Labs: Potassium (mmol/L)  Date Value  05/22/2024 3.6  05/01/2012 3.5   Magnesium  (mg/dL)  Date Value  91/86/7974 1.9  04/30/2012 1.8   Calcium  (mg/dL)  Date Value  91/86/7974 7.6 (L)   Calcium , Total (mg/dL)  Date Value  92/76/7986 8.9   Albumin  (g/dL)  Date Value  91/87/7974 2.5 (L)  04/30/2012 3.5   Phosphorus (mg/dL)  Date Value  91/86/7974 2.4 (L)   Sodium (mmol/L)  Date Value  05/22/2024 136  05/01/2012 142   Height: 5' 7 (170.2 cm) Weight: 78.9 kg (173 lb 15.1 oz) IBW/kg (Calculated) : 66.1 Estimated Creatinine Clearance: 74.6 mL/min (A) (by C-G formula based on SCr of 0.37 mg/dL (L)).  Assessment  Nicklos Gaxiola is a 73 y.o. male presenting with GIB, AKI, hypokalemia. PMH significant for GERD, depression, type 2 diabetes mellitus, hypertension and PTSD. Pharmacy has been consulted to monitor and replace electrolytes.  Diet: carb modified MIVF: NS at 150 cc/hr Pertinent medications: Scheduled Kcl po 20meq daily - HELD on 8/11 until more stable  Goal of Therapy: Electrolytes within normal limits  Plan:  Electrolytes with normal limits but blood draw done while Kphos bag was till infusing. No additional replacement needed at this time. Check Phos and K 6-10 hours after completing Kphos infusion.   Audriana Aldama Rodriguez-Guzman PharmD, BCPS 05/22/2024 7:24 AM

## 2024-05-23 LAB — CULTURE, BLOOD (ROUTINE X 2)
Culture: NO GROWTH
Culture: NO GROWTH

## 2024-06-01 ENCOUNTER — Emergency Department
Admission: EM | Admit: 2024-06-01 | Discharge: 2024-06-01 | Discharge status: Against Medical Advice | Attending: Emergency Medicine | Admitting: Emergency Medicine

## 2024-06-01 ENCOUNTER — Encounter: Payer: Self-pay | Admitting: *Deleted

## 2024-06-01 ENCOUNTER — Other Ambulatory Visit: Payer: Self-pay

## 2024-06-01 DIAGNOSIS — M79605 Pain in left leg: Secondary | ICD-10-CM | POA: Insufficient documentation

## 2024-06-01 DIAGNOSIS — M545 Low back pain, unspecified: Secondary | ICD-10-CM | POA: Insufficient documentation

## 2024-06-01 DIAGNOSIS — Z5321 Procedure and treatment not carried out due to patient leaving prior to being seen by health care provider: Secondary | ICD-10-CM | POA: Insufficient documentation

## 2024-06-01 DIAGNOSIS — R197 Diarrhea, unspecified: Secondary | ICD-10-CM | POA: Diagnosis present

## 2024-06-01 DIAGNOSIS — M79604 Pain in right leg: Secondary | ICD-10-CM | POA: Insufficient documentation

## 2024-06-01 DIAGNOSIS — R109 Unspecified abdominal pain: Secondary | ICD-10-CM | POA: Diagnosis not present

## 2024-06-01 LAB — COMPREHENSIVE METABOLIC PANEL WITH GFR
ALT: 18 U/L (ref 0–44)
AST: 21 U/L (ref 15–41)
Albumin: 3.5 g/dL (ref 3.5–5.0)
Alkaline Phosphatase: 101 U/L (ref 38–126)
Anion gap: 10 (ref 5–15)
BUN: 15 mg/dL (ref 8–23)
CO2: 26 mmol/L (ref 22–32)
Calcium: 9.1 mg/dL (ref 8.9–10.3)
Chloride: 101 mmol/L (ref 98–111)
Creatinine, Ser: 0.5 mg/dL — ABNORMAL LOW (ref 0.61–1.24)
GFR, Estimated: >60 mL/min (ref >60)
Glucose, Bld: 135 mg/dL — ABNORMAL HIGH (ref 70–99)
Potassium: 2.7 mmol/L — CL (ref 3.5–5.1)
Sodium: 137 mmol/L (ref 135–145)
Total Bilirubin: 0.6 mg/dL (ref 0.0–1.2)
Total Protein: 6 g/dL — ABNORMAL LOW (ref 6.5–8.1)

## 2024-06-01 LAB — CBC
HCT: 33.7 % — ABNORMAL LOW (ref 39.0–52.0)
Hemoglobin: 12.2 g/dL — ABNORMAL LOW (ref 13.0–17.0)
MCH: 37.7 pg — ABNORMAL HIGH (ref 26.0–34.0)
MCHC: 36.2 g/dL — ABNORMAL HIGH (ref 30.0–36.0)
MCV: 104 fL — ABNORMAL HIGH (ref 80.0–100.0)
Platelets: 171 K/uL (ref 150–400)
RBC: 3.24 MIL/uL — ABNORMAL LOW (ref 4.22–5.81)
RDW: 13.5 % (ref 11.5–15.5)
WBC: 4.9 K/uL (ref 4.0–10.5)
nRBC: 0 % (ref 0.0–0.2)

## 2024-06-01 LAB — LIPASE, BLOOD: Lipase: 56 U/L — ABNORMAL HIGH (ref 11–51)

## 2024-06-01 NOTE — ED Triage Notes (Signed)
 PT arrived via [facility] from home c/o back pain and bilateral leg pain x 1 month, worse on L lower  back, c/o diarrhea x 1 month.  Hx of falls, VSS with EMS.   Pt has hx of bed bugs, none noted on pt with EMS.

## 2024-06-01 NOTE — ED Triage Notes (Signed)
 Pt c/o lower back pain and leg pain. Denies injury. Left sided abdominal pain and diarrhea. Denies fevers. Last took tylenol  for pain about 45 minutes ago.

## 2024-06-01 NOTE — ED Notes (Signed)
 Patient aware that we need urine sample for testing, unable at this time. Pt given instruction on providing urine sample when able to do so.

## 2024-06-02 ENCOUNTER — Emergency Department

## 2024-06-02 ENCOUNTER — Emergency Department: Admission: EM | Admit: 2024-06-02 | Discharge: 2024-06-02 | Disposition: A | Discharge status: Home / Self Care

## 2024-06-02 DIAGNOSIS — K5730 Diverticulosis of large intestine without perforation or abscess without bleeding: Secondary | ICD-10-CM | POA: Diagnosis not present

## 2024-06-02 DIAGNOSIS — I10 Essential (primary) hypertension: Secondary | ICD-10-CM | POA: Diagnosis not present

## 2024-06-02 DIAGNOSIS — M545 Low back pain, unspecified: Secondary | ICD-10-CM | POA: Insufficient documentation

## 2024-06-02 DIAGNOSIS — E119 Type 2 diabetes mellitus without complications: Secondary | ICD-10-CM | POA: Insufficient documentation

## 2024-06-02 DIAGNOSIS — E876 Hypokalemia: Secondary | ICD-10-CM | POA: Insufficient documentation

## 2024-06-02 DIAGNOSIS — R109 Unspecified abdominal pain: Secondary | ICD-10-CM

## 2024-06-02 DIAGNOSIS — R1032 Left lower quadrant pain: Secondary | ICD-10-CM | POA: Diagnosis present

## 2024-06-02 LAB — URINALYSIS, ROUTINE W REFLEX MICROSCOPIC
Bacteria, UA: NONE SEEN
Bilirubin Urine: NEGATIVE
Glucose, UA: 50 mg/dL — AB
Hgb urine dipstick: NEGATIVE
Ketones, ur: NEGATIVE mg/dL
Leukocytes,Ua: NEGATIVE
Nitrite: NEGATIVE
Protein, ur: 100 mg/dL — AB
Specific Gravity, Urine: 1.044 — ABNORMAL HIGH (ref 1.005–1.030)
pH: 5 (ref 5.0–8.0)

## 2024-06-02 LAB — BASIC METABOLIC PANEL WITH GFR
Anion gap: 9 (ref 5–15)
BUN: 11 mg/dL (ref 8–23)
CO2: 28 mmol/L (ref 22–32)
Calcium: 9.1 mg/dL (ref 8.9–10.3)
Chloride: 98 mmol/L (ref 98–111)
Creatinine, Ser: 0.43 mg/dL — ABNORMAL LOW (ref 0.61–1.24)
GFR, Estimated: >60 mL/min (ref >60)
Glucose, Bld: 145 mg/dL — ABNORMAL HIGH (ref 70–99)
Potassium: 3.6 mmol/L (ref 3.5–5.1)
Sodium: 135 mmol/L (ref 135–145)

## 2024-06-02 LAB — CBC
HCT: 31 % — ABNORMAL LOW (ref 39.0–52.0)
Hemoglobin: 11.4 g/dL — ABNORMAL LOW (ref 13.0–17.0)
MCH: 38 pg — ABNORMAL HIGH (ref 26.0–34.0)
MCHC: 36.8 g/dL — ABNORMAL HIGH (ref 30.0–36.0)
MCV: 103.3 fL — ABNORMAL HIGH (ref 80.0–100.0)
Platelets: 132 K/uL — ABNORMAL LOW (ref 150–400)
RBC: 3 MIL/uL — ABNORMAL LOW (ref 4.22–5.81)
RDW: 13.8 % (ref 11.5–15.5)
WBC: 3.6 K/uL — ABNORMAL LOW (ref 4.0–10.5)
nRBC: 0 % (ref 0.0–0.2)

## 2024-06-02 LAB — COMPREHENSIVE METABOLIC PANEL WITH GFR
ALT: 16 U/L (ref 0–44)
AST: 19 U/L (ref 15–41)
Albumin: 3.2 g/dL — ABNORMAL LOW (ref 3.5–5.0)
Alkaline Phosphatase: 108 U/L (ref 38–126)
Anion gap: 10 (ref 5–15)
BUN: 14 mg/dL (ref 8–23)
CO2: 25 mmol/L (ref 22–32)
Calcium: 9 mg/dL (ref 8.9–10.3)
Chloride: 100 mmol/L (ref 98–111)
Creatinine, Ser: 0.49 mg/dL — ABNORMAL LOW (ref 0.61–1.24)
GFR, Estimated: >60 mL/min (ref >60)
Glucose, Bld: 116 mg/dL — ABNORMAL HIGH (ref 70–99)
Potassium: 2.7 mmol/L — CL (ref 3.5–5.1)
Sodium: 135 mmol/L (ref 135–145)
Total Bilirubin: 0.7 mg/dL (ref 0.0–1.2)
Total Protein: 5.7 g/dL — ABNORMAL LOW (ref 6.5–8.1)

## 2024-06-02 LAB — ACETAMINOPHEN LEVEL
Acetaminophen (Tylenol), Serum: 20 ug/mL (ref 10–30)
Acetaminophen (Tylenol), Serum: <10 ug/mL — ABNORMAL LOW (ref 10–30)

## 2024-06-02 LAB — LIPASE, BLOOD: Lipase: 64 U/L — ABNORMAL HIGH (ref 11–51)

## 2024-06-02 MED ORDER — POTASSIUM CHLORIDE CRYS ER 20 MEQ PO TBCR
40.0000 meq | EXTENDED_RELEASE_TABLET | Freq: Once | ORAL | Status: AC
  Administered 2024-06-02: 40 meq via ORAL
  Filled 2024-06-02: qty 2

## 2024-06-02 MED ORDER — MAGNESIUM SULFATE 2 GM/50ML IV SOLN
2.0000 g | Freq: Once | INTRAVENOUS | Status: AC
  Administered 2024-06-02: 2 g via INTRAVENOUS
  Filled 2024-06-02: qty 50

## 2024-06-02 MED ORDER — POTASSIUM CHLORIDE 10 MEQ/100ML IV SOLN
10.0000 meq | Freq: Once | INTRAVENOUS | Status: AC
  Administered 2024-06-02: 10 meq via INTRAVENOUS
  Filled 2024-06-02: qty 100

## 2024-06-02 MED ORDER — OXYCODONE-ACETAMINOPHEN 5-325 MG PO TABS
1.0000 | ORAL_TABLET | Freq: Once | ORAL | Status: AC
  Administered 2024-06-02: 1 via ORAL
  Filled 2024-06-02: qty 1

## 2024-06-02 MED ORDER — KETOROLAC TROMETHAMINE 15 MG/ML IJ SOLN
15.0000 mg | Freq: Once | INTRAMUSCULAR | Status: AC
  Administered 2024-06-02: 15 mg via INTRAVENOUS
  Filled 2024-06-02: qty 1

## 2024-06-02 MED ORDER — IOHEXOL 300 MG/ML  SOLN
100.0000 mL | Freq: Once | INTRAMUSCULAR | Status: AC | PRN
  Administered 2024-06-02: 100 mL via INTRAVENOUS

## 2024-06-02 MED ORDER — OXYCODONE HCL 5 MG PO TABS: 5.0000 mg | ORAL_TABLET | Freq: Three times a day (TID) | ORAL | 0 refills | Status: DC | PRN

## 2024-06-02 MED ORDER — POTASSIUM CHLORIDE CRYS ER 10 MEQ PO TBCR: 10.0000 meq | EXTENDED_RELEASE_TABLET | Freq: Two times a day (BID) | ORAL | 0 refills | Status: AC

## 2024-06-02 NOTE — ED Provider Notes (Signed)
-----------------------------------------   4:53 PM on 06/02/2024 ----------------------------------------- Patient CT scan essentially negative for acute abnormality.  Patient's repeat Tylenol  level has resulted less than 10.  Potassium level is low at 2.7.  Will discharge with potassium supplements.  Will discharge with a short course of pain medication and have the patient follow-up with his doctor.  Patient agreeable to plan of care.  I discussed with the patient to limit Tylenol  to no more than 4000 mg / 4 g in any 24-hour period.  Patient understands this.   Dorothyann Drivers, MD 06/02/24 [phone]

## 2024-06-02 NOTE — ED Provider Notes (Signed)
 [facility] Provider Note    Event Date/Time   First MD Initiated Contact with Patient 06/02/24 1341     (approximate)   History   Back Pain  Pt comes via EMS from home with c/o lower left back pain for two days. Pt states it radiates around. Pt states he has taken tylenol  with no relief.   EMs reports about 78 tylenol  pills in the last 48 hrs.   VSS  See first nurse note. Pt reports left lower back pain x1 week. Reports taking large amounts of tylenol  to try to relieve the pain.   HPI David Christensen is a 73 y.o. male PMH hypertension, hypokalemia, pancytopenia, diabetes, PTSD presents for evaluation of left flank/abdominal pain - Patient tells me he has been having left lower back discomfort for about 1 week.  No preceding trauma.  Not on blood thinners.  No fevers.  Does note some mild dysuria recently.  Says he has been taking large amount of Tylenol  to help with pain though exact amount is unclear, does not give same story to me that he gave at triage. - No pain radiation down leg - No vomiting, no diarrhea.  Has been having normal bowel movements.   Per chart review, patient was admitted earlier this month for possible GI bleed, ultimately felt to be anemia of chronic disease.  Also had sepsis due to pneumonia, treated.  Hypokalemia thought to be due to GI losses.     Physical Exam   Triage Vital Signs: ED Triage Vitals  Encounter Vitals Group     BP 06/02/24 1210 (!) 161/71     Girls Systolic BP Percentile --      Girls Diastolic BP Percentile --      Boys Systolic BP Percentile --      Boys Diastolic BP Percentile --      Pulse Rate 06/02/24 1210 62     Resp 06/02/24 1210 18     Temp 06/02/24 1210 98.4 F (36.9 C)     Temp Source 06/02/24 1210 Oral     SpO2 06/02/24 1210 98 %     Weight 06/02/24 1211 173 lb 15.1 oz (78.9 kg)     Height 06/02/24 1211 5' 7 (1.702 m)     Head Circumference --      Peak Flow --      Pain Score 06/02/24  1211 10     Pain Loc --      Pain Education --      Exclude from Growth Chart --     Most recent vital signs: Vitals:   06/02/24 1510 06/02/24 1530  BP: (!) 169/93 (!) 163/82  Pulse:  60  Resp:  (!) 21  Temp:    SpO2:  100%     General: Awake, no distress.  CV:  Good peripheral perfusion. RRR, RP 2+ Resp:  Normal effort. CTAB Abd:  No distention.  Moderate tenderness to palpation left lower quadrant only, no tenderness elsewhere in the abdomen.  No CVA tenderness bilaterally.    ED Results / Procedures / Treatments   Labs (all labs ordered are listed, but only abnormal results are displayed) Labs Reviewed  LIPASE, BLOOD - Abnormal; Notable for the following components:      Result Value   Lipase 64 (*)    All other components within normal limits  COMPREHENSIVE METABOLIC PANEL WITH GFR - Abnormal; Notable for the following components:   Potassium 2.7 (*)  Glucose, Bld 116 (*)    Creatinine, Ser 0.49 (*)    Total Protein 5.7 (*)    Albumin  3.2 (*)    All other components within normal limits  CBC - Abnormal; Notable for the following components:   WBC 3.6 (*)    RBC 3.00 (*)    Hemoglobin 11.4 (*)    HCT 31.0 (*)    MCV 103.3 (*)    MCH 38.0 (*)    MCHC 36.8 (*)    Platelets 132 (*)    All other components within normal limits  URINALYSIS, ROUTINE W REFLEX MICROSCOPIC - Abnormal; Notable for the following components:   Color, Urine YELLOW (*)    APPearance HAZY (*)    Specific Gravity, Urine 1.044 (*)    Glucose, UA 50 (*)    Protein, ur 100 (*)    All other components within normal limits  ACETAMINOPHEN  LEVEL  ACETAMINOPHEN  LEVEL  BASIC METABOLIC PANEL WITH GFR     EKG  Ecg = sinus rhythm, rate 60, no gross ST elevation or depression, no significant repolarization abnormality, normal axis, normal intervals.  No U waves.  No clear findings of hypokalemia on my read and no evidence of ischemia.   RADIOLOGY pending   PROCEDURES:  Critical Care  performed: No  Procedures   MEDICATIONS ORDERED IN ED: Medications  potassium chloride  10 mEq in 100 mL IVPB (10 mEq Intravenous New Bag/Given 06/02/24 1504)  ketorolac  (TORADOL ) 15 MG/ML injection 15 mg (15 mg Intravenous Given 06/02/24 1429)  potassium chloride  SA (KLOR-CON  M) CR tablet 40 mEq (40 mEq Oral Given 06/02/24 1434)  magnesium  sulfate IVPB 2 g 50 mL (2 g Intravenous New Bag/Given 06/02/24 1433)  iohexol  (OMNIPAQUE ) 300 MG/ML solution 100 mL (100 mLs Intravenous Contrast Given 06/02/24 1441)     IMPRESSION / MDM / ASSESSMENT AND PLAN / ED COURSE  I reviewed the triage vital signs and the nursing notes.                              DDX/MDM/AP: Differential diagnosis includes, but is not limited to, diverticulitis, urolithiasis, MSK strain, doubt atypical appendicitis, doubt bowel obstruction.  Do not suspect acute spinal pathology at this time given lateralization of discomfort and no red flag symptoms.  Consider possibility of Tylenol  poisoning though not clear on initial eval.  Plan: - Labs - Small dose of Toradol  assuming normal renal function - CT abdomen pelvis  Patient's presentation is most consistent with acute presentation with potential threat to life or bodily function.  The patient is on the cardiac monitor to evaluate for evidence of arrhythmia and/or significant heart rate changes.  ED course below.  Workup with hypokalemia to 2.7, ECG with no related changes.  Repleted potassium p.o. and IV empirically replete magnesium .  Urinalysis with no evidence of infection, no significant hematuria.  CBC, CMP otherwise unremarkable.  CT pending.  Signed out to oncoming ED provider pending follow-up of CT.  Patient may also benefit from a repeat Tylenol  level 4 hours from initial to ensure not uptrending, unclear historian, overall doubt genuine Tylenol  toxicity.  Clinical Course as of 06/02/24 1553  Sun Jun 02, 2024  1348 CMP with hypokalemia 2.7, otherwise  unremarkable.  Liver function normal. [MM]  1349 CBC reviewed, at baseline [MM]  1349 Urinalysis reviewed, unremarkable [MM]  1455 Tylenol  level normal, do not suspect genuine underlying Tylenol  overdose [MM]    Clinical Course  User Index [MM] Clarine Ozell LABOR, MD     FINAL CLINICAL IMPRESSION(S) / ED DIAGNOSES   Final diagnoses:  Hypokalemia  Left flank pain  LLQ abdominal pain     Rx / DC Orders   ED Discharge Orders     None        Note:  This document was prepared using Dragon voice recognition software and may include unintentional dictation errors.   Clarine Ozell LABOR, MD 06/02/24 [phone]

## 2024-06-02 NOTE — ED Notes (Signed)
 Patient transported to CT

## 2024-06-02 NOTE — ED Notes (Signed)
 MD Claudene informed of K+ of 2.7

## 2024-06-02 NOTE — ED Triage Notes (Signed)
 See first nurse note. Pt reports left lower back pain x1 week. Reports taking large amounts of tylenol  to try to relieve the pain.

## 2024-06-02 NOTE — ED Triage Notes (Signed)
 Pt comes via EMS from home with c/o lower left back pain for two days. Pt states it radiates around. Pt states he has taken tylenol  with no relief.   EMs reports about 78 tylenol  pills in the last 48 hrs.   VSS

## 2024-06-02 NOTE — ED Notes (Addendum)
 This RN spoke with [place], pts daughter, and she stated she is on the way to pick pt up. Pt decided to wait on a bench outside the hospital ER entrance.

## 2024-06-02 NOTE — Discharge Instructions (Addendum)
 You have been prescribed a pain medication.  Please only take this pain medication as prescribed.  Do not drink alcohol or drive while taking his pain medication.  You may still take Tylenol  as needed as written on the box.  As we discussed do not take more than 4000 mg / 4 g in any 24-hour period.  Please follow-up with your doctor.  Return to the emergency department for any worsening pain fever or any other symptom personally concerning to yourself.  Your potassium was found to be low today please take your potassium supplement for the next 7 days as prescribed.

## 2024-06-06 ENCOUNTER — Inpatient Hospital Stay

## 2024-06-06 ENCOUNTER — Inpatient Hospital Stay: Admitting: Internal Medicine

## 2024-06-25 ENCOUNTER — Other Ambulatory Visit: Payer: Self-pay

## 2024-06-25 ENCOUNTER — Emergency Department
Admission: EM | Admit: 2024-06-25 | Discharge: 2024-06-25 | Discharge status: Against Medical Advice | Attending: Emergency Medicine | Admitting: Emergency Medicine

## 2024-06-25 DIAGNOSIS — R1110 Vomiting, unspecified: Secondary | ICD-10-CM | POA: Diagnosis not present

## 2024-06-25 DIAGNOSIS — R1030 Lower abdominal pain, unspecified: Secondary | ICD-10-CM | POA: Insufficient documentation

## 2024-06-25 DIAGNOSIS — Z5321 Procedure and treatment not carried out due to patient leaving prior to being seen by health care provider: Secondary | ICD-10-CM | POA: Insufficient documentation

## 2024-06-25 DIAGNOSIS — Z950 Presence of cardiac pacemaker: Secondary | ICD-10-CM | POA: Insufficient documentation

## 2024-06-25 LAB — COMPREHENSIVE METABOLIC PANEL WITH GFR
ALT: 13 U/L (ref 0–44)
AST: 19 U/L (ref 15–41)
Albumin: 4.1 g/dL (ref 3.5–5.0)
Alkaline Phosphatase: 74 U/L (ref 38–126)
Anion gap: 9 (ref 5–15)
BUN: 12 mg/dL (ref 8–23)
CO2: 28 mmol/L (ref 22–32)
Calcium: 9.2 mg/dL (ref 8.9–10.3)
Chloride: 100 mmol/L (ref 98–111)
Creatinine, Ser: 0.5 mg/dL — ABNORMAL LOW (ref 0.61–1.24)
GFR, Estimated: >60 mL/min (ref >60)
Glucose, Bld: 118 mg/dL — ABNORMAL HIGH (ref 70–99)
Potassium: 4.4 mmol/L (ref 3.5–5.1)
Sodium: 137 mmol/L (ref 135–145)
Total Bilirubin: 0.8 mg/dL (ref 0.0–1.2)
Total Protein: 6.7 g/dL (ref 6.5–8.1)

## 2024-06-25 LAB — CBC
HCT: 35.3 % — ABNORMAL LOW (ref 39.0–52.0)
Hemoglobin: 12 g/dL — ABNORMAL LOW (ref 13.0–17.0)
MCH: 36.7 pg — ABNORMAL HIGH (ref 26.0–34.0)
MCHC: 34 g/dL (ref 30.0–36.0)
MCV: 108 fL — ABNORMAL HIGH (ref 80.0–100.0)
Platelets: 100 K/uL — ABNORMAL LOW (ref 150–400)
RBC: 3.27 MIL/uL — ABNORMAL LOW (ref 4.22–5.81)
RDW: 12.9 % (ref 11.5–15.5)
WBC: 3.5 K/uL — ABNORMAL LOW (ref 4.0–10.5)
nRBC: 0 % (ref 0.0–0.2)

## 2024-06-25 LAB — LIPASE, BLOOD: Lipase: 27 U/L (ref 11–51)

## 2024-06-25 NOTE — ED Triage Notes (Signed)
 Arrived by [facility] from home. C/o lower abdominal pain with constipation X4 days. OTC meds with no results. Lower abdomen painful to touch. 1 episode of emesis last night  History pacemaker  EMS vitals: 138/70 b/p 90HR 96% 3L chronic 148CBG  EMS reports house has a history of bedbugs years ago - EMS states they didn't see any today

## 2024-06-25 NOTE — ED Notes (Signed)
Last BM 5 days ago. 

## 2024-06-26 ENCOUNTER — Other Ambulatory Visit: Payer: Self-pay

## 2024-06-27 ENCOUNTER — Other Ambulatory Visit: Payer: Self-pay

## 2024-07-02 ENCOUNTER — Other Ambulatory Visit: Payer: Self-pay

## 2024-08-08 ENCOUNTER — Ambulatory Visit: Admitting: Student in an Organized Health Care Education/Training Program

## 2024-10-15 ENCOUNTER — Ambulatory Visit
Attending: Student in an Organized Health Care Education/Training Program | Admitting: Student in an Organized Health Care Education/Training Program

## 2024-10-15 ENCOUNTER — Encounter: Payer: Self-pay | Admitting: Student in an Organized Health Care Education/Training Program

## 2024-10-15 VITALS — BP 175/88 | HR 87 | Temp 96.6°F | Resp 16 | Ht 67.0 in | Wt 173.0 lb

## 2024-10-15 DIAGNOSIS — Z79891 Long term (current) use of opiate analgesic: Secondary | ICD-10-CM

## 2024-10-15 DIAGNOSIS — G8929 Other chronic pain: Secondary | ICD-10-CM | POA: Insufficient documentation

## 2024-10-15 DIAGNOSIS — M79605 Pain in left leg: Secondary | ICD-10-CM | POA: Diagnosis present

## 2024-10-15 DIAGNOSIS — M79604 Pain in right leg: Secondary | ICD-10-CM | POA: Insufficient documentation

## 2024-10-15 DIAGNOSIS — G894 Chronic pain syndrome: Secondary | ICD-10-CM | POA: Insufficient documentation

## 2024-10-15 DIAGNOSIS — G629 Polyneuropathy, unspecified: Secondary | ICD-10-CM | POA: Insufficient documentation

## 2024-10-15 DIAGNOSIS — F1110 Opioid abuse, uncomplicated: Secondary | ICD-10-CM | POA: Diagnosis present

## 2024-10-15 NOTE — Progress Notes (Signed)
 PROVIDER NOTE: Interpretation of information contained herein should be left to medically-trained personnel. Specific patient instructions are provided elsewhere under Patient Instructions section of medical record. This document was created in part using AI and STT-dictation technology, any transcriptional errors that may result from this process are unintentional.  Patient: David Christensen  Service: E/M Encounter  Provider: Wallie Sherry, MD  DOB: 30-Sep-1951  Delivery: Face-to-face  Specialty: Interventional Pain Management  MRN: [id number]  Setting: Ambulatory outpatient facility  Specialty designation: 09  Type: New Patient  Location: Outpatient office facility  PCP: Pablo Perkins, FNP  DOS: 10/15/2024    Referring Prov.: Dolleschel, Perkins, FNP   Primary Reason(s) for Visit: Encounter for initial evaluation of one or more chronic problems (new to examiner) potentially causing chronic pain, and posing a threat to normal musculoskeletal function. (Level of risk: High) CC: Leg Pain (Lower, bilateral)  HPI  David Christensen is a 74 y.o. year old, male patient, who comes for the first time to our practice referred by Dolleschel, Perkins, FNP for our initial evaluation of his chronic pain. He has Abdominal pain; Cholecystitis; GI bleeding; Elevated LFTs; AKI (acute kidney injury); Sepsis due to pneumonia (HCC); Essential hypertension; Hypokalemia; Dyslipidemia; Depression; Pancytopenia (HCC); Melena; Iron deficiency anemia due to chronic blood loss; Chronic pain syndrome; Chronic pain of both lower extremities; and Polyneuropathy on their problem list. Today he comes in for evaluation of his Leg Pain (Lower, bilateral)  Pain Assessment: Location: Right, Left, Lower Leg Radiating: denies Onset: More than a month ago Duration: Chronic pain Quality: Throbbing Severity: 6 /10 (subjective, self-reported pain score)  Effect on ADL: difficulty performing daily activities Timing: Constant Modifying factors:  nothing BP: (!) 175/88  HR: 87  Onset and Duration: Gradual and Present longer than 3 months Cause of pain: Unknown Severity: NAS-11 at its worse: 10/10 and NAS-11 now: 8/10 Timing: Not influenced by the time of the day Aggravating Factors: nothing listed Alleviating Factors: Medications Associated Problems: Pain that does not allow patient to sleep Quality of Pain: Agonizing and Throbbing Previous Examinations or Tests: CT scan Previous Treatments: Narcotic medications  David Christensen is being evaluated for possible interventional pain management therapies for the treatment of his chronic pain.   Discussed the use of AI scribe software for clinical note transcription with the patient, who gave verbal consent to proceed.  History of Present Illness   David Christensen is a 74 year old male who presents with chronic calf muscle pain for pain management consultation. He was referred for pain management evaluation.  He has experienced chronic pain in his calf muscles since 2005, initially described as a 'crawling' sensation that later developed into pain. He has been under pain management care previously and was on Percocet until last year. He suspects his daughter, who has a gambling habit, may have taken his medication, but he cannot prove it.  He has tried various treatments including Belbuca and Butrans patches, but did not find them beneficial. He was prescribed tramadol  upon discharge from the hospital, but discontinuation of medications significantly impacted his quality of life, stating 'it killed me'. He describes himself as being 'very active' when on medications.  He has undergone physical therapy in the past summer and has tried TENS units without significant relief. He has also had two injections, one in his back and one at his foot, which he found painful and ineffective.  No cramps in his calves and an ultrasound of his legs conducted last year showed good blood flow. He has a  pacemaker  and is currently under psychiatric care, receiving Botox and Seroquel  to aid sleep. He reports significant sleep disturbances, stating he has gone 'two and a half nights without sleep'.        Historic Controlled Substance Pharmacotherapy Review  Historical Monitoring: The patient  reports no history of drug use. List of prior UDS Testing: Lab Results  Component Value Date   MDMA NONE DETECTED 04/18/2024   COCAINSCRNUR NONE DETECTED 04/18/2024   PCPSCRNUR NONE DETECTED 04/18/2024   THCU NONE DETECTED 04/18/2024   ETH <15 05/18/2024   Historical Background Evaluation: [place] PMP: PDMP reviewed during this encounter. Review of the past 77-months conducted.             [place] Department of public safety, offender search: Engineer, [employer] Information) Non-contributory Risk Assessment Profile: Aberrant behavior: None observed or detected today Risk factors for fatal opioid overdose: history of non-compliance with medical advice and and psychiatric disease  Fatal overdose hazard ratio (HR): Calculation deferred Non-fatal overdose hazard ratio (HR): Calculation deferred Risk of opioid abuse or dependence: 0.7-3.0% with doses <= 36 MME/day and 6.1-26% with doses >= 120 MME/day. Substance use disorder (SUD) risk level: Moderate   Opioid Risk Tool - 10/15/24 1025       Family History of Substance Abuse   Alcohol Positive Male    Illegal Drugs Negative    Rx Drugs Negative      Personal History of Substance Abuse   Alcohol Negative    Illegal Drugs Negative    Rx Drugs Negative      Age   Age between 15-45 years  No      History of Preadolescent Sexual Abuse   History of Preadolescent Sexual Abuse Negative or Male      Psychological Disease   Psychological Disease Positive    ADD Negative    OCD Negative    Bipolar Negative    Schizophrenia Negative    Depression Negative      Total Score   Opioid Risk Tool Scoring 5    Opioid Risk Interpretation Moderate Risk         ORT Scoring  interpretation table:  Score <3 = Low Risk for SUD  Score between 4-7 = Moderate Risk for SUD  Score >8 = High Risk for Opioid Abuse   PHQ-2 Depression Scale:  Total score: 0  PHQ-2 Scoring interpretation table: (Score and probability of major depressive disorder)  Score 0 = No depression  Score 1 = 15.4% Probability  Score 2 = 21.1% Probability  Score 3 = 38.4% Probability  Score 4 = 45.5% Probability  Score 5 = 56.4% Probability  Score 6 = 78.6% Probability   PHQ-9 Depression Scale:  Total score: 0  PHQ-9 Scoring interpretation table:  Score 0-4 = No depression  Score 5-9 = Mild depression  Score 10-14 = Moderate depression  Score 15-19 = Moderately severe depression  Score 20-27 = Severe depression (2.4 times higher risk of SUD and 2.89 times higher risk of overuse)   Pharmacologic Plan: Buprenorphine only             Meds  Current Medications[1]  Imaging Review    Narrative EXAM: CT HEAD AND CERVICAL SPINE 05/14/2024 01:17:01 AM  TECHNIQUE: CT of the head and cervical spine was performed without the administration of intravenous contrast. Multiplanar reformatted images are provided for review. Automated exposure control, iterative reconstruction, and/or weight based adjustment of the mA/kV was utilized to reduce the radiation dose to as  low as reasonably achievable.  COMPARISON: None available.  CLINICAL HISTORY: Headache, no red flags.  FINDINGS: CT HEAD  BRAIN AND VENTRICLES: No acute intracranial hemorrhage. No mass effect or midline shift. No abnormal extra-axial fluid collection. Gray-white differentiation is maintained. No hydrocephalus. Mild chronic ischemic white matter changes.  ORBITS: No acute abnormality.  SINUSES AND MASTOIDS: No acute abnormality.  SOFT TISSUES AND SKULL: No acute skull fracture. No acute soft tissue abnormality.  CT CERVICAL SPINE  BONES AND ALIGNMENT: No acute fracture or traumatic  malalignment.  DEGENERATIVE CHANGES: No significant degenerative changes.  SOFT TISSUES: No prevertebral soft tissue swelling.  IMPRESSION: 1. No acute intracranial abnormality. 2. Mild chronic ischemic white matter changes.  Electronically signed by: Franky Stanford MD 05/14/2024 01:26 AM EDT RP Workstation: [id number]  DG Knee Complete 4 Views Right  Narrative CLINICAL DATA:  Twisting injury, pain  EXAM: RIGHT KNEE - COMPLETE 4+ VIEW  COMPARISON:  None.  FINDINGS: No fracture or dislocation of the right knee. Joint spaces are preserved. Small knee joint effusion the soft tissue edema anteriorly.  IMPRESSION: No fracture or dislocation of the right knee. Small knee joint effusion.   Electronically Signed By: Marolyn Jaksch M.D. On: 12/08/2019 11:28    Narrative CLINICAL DATA:  fall w/ BL foot and elbow pain  EXAM: RIGHT FOOT COMPLETE - 3+ VIEW  COMPARISON:  None Available.  FINDINGS: No acute fracture or dislocation. There is no evidence of arthropathy or other focal bone abnormality. Soft tissues are unremarkable. No radiopaque foreign body.  IMPRESSION: No acute fracture or dislocation.   Electronically Signed By: Rogelia Myers M.D. On: 05/17/2024 17:56  Foot-L DG Complete: Results for orders placed during the hospital encounter of 05/17/24  DG Foot Complete Left  Narrative CLINICAL DATA:  fall w/ BL foot and elbow pain  EXAM: LEFT FOOT - COMPLETE 3+ VIEW  COMPARISON:  None Available.  FINDINGS: No acute fracture or dislocation. Small undersurface calcaneal heel spur. Small Achilles insertion enthesophyte. Soft tissues are unremarkable. No radiopaque foreign body.  IMPRESSION: No acute fracture or dislocation.   Electronically Signed By: Rogelia Myers M.D. On: 05/17/2024 17:57    Complexity Note: Imaging results reviewed.                         ROS  Cardiovascular: Pacemaker or defibrillator Pulmonary or Respiratory:  Lung problems and Smoking Neurological: No reported neurological signs or symptoms such as seizures, abnormal skin sensations, urinary and/or fecal incontinence, being born with an abnormal open spine and/or a tethered spinal cord Psychological-Psychiatric: Anxiousness and Difficulty sleeping and or falling asleep Gastrointestinal: No reported gastrointestinal signs or symptoms such as vomiting or evacuating blood, reflux, heartburn, alternating episodes of diarrhea and constipation, inflamed or scarred liver, or pancreas or irrregular and/or infrequent bowel movements Genitourinary: No reported renal or genitourinary signs or symptoms such as difficulty voiding or producing urine, peeing blood, non-functioning kidney, kidney stones, difficulty emptying the bladder, difficulty controlling the flow of urine, or chronic kidney disease Hematological: Brusing easily Endocrine: 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 Porr is allergic to alpha-gal, metformin and related, and penicillins.  Laboratory Chemistry Profile   Renal Lab Results  Component Value Date   BUN 12 06/25/2024   CREATININE 0.50 (L) 06/25/2024   GFRAA >60 02/13/2020   GFRNONAA >60 06/25/2024   PROTEINUR 100 (A) 06/02/2024     Electrolytes Lab Results  Component Value Date   NA 137 06/25/2024   K 4.4 06/25/2024   CL 100 06/25/2024   CALCIUM  9.2 06/25/2024   MG 1.9 05/22/2024   PHOS 2.5 05/22/2024     Hepatic Lab Results  Component Value Date   AST 19 06/25/2024   ALT 13 06/25/2024   ALBUMIN  4.1 06/25/2024   ALKPHOS 74 06/25/2024   LIPASE 27  06/25/2024   AMMONIA 21 05/18/2024     ID Lab Results  Component Value Date   SARSCOV2NAA NEGATIVE 05/18/2024   HCVAB NON REACTIVE 05/19/2024     Bone No results found for: VD25OH, [id number], CI6874NY7, CI7874NY7, 25OHVITD1, 25OHVITD2, 25OHVITD3, TESTOFREE, TESTOSTERONE   Endocrine Lab Results  Component Value Date   GLUCOSE 118 (H) 06/25/2024   GLUCOSEU 50 (A) 06/02/2024   HGBA1C 5.6 05/18/2024   TSH 2.01 04/30/2012     Neuropathy Lab Results  Component Value Date   VITAMINB12 794 05/19/2024   FOLATE 14.6 05/19/2024   HGBA1C 5.6 05/18/2024     CNS No results found for: COLORCSF, APPEARCSF, RBCCOUNTCSF, WBCCSF, POLYSCSF, LYMPHSCSF, EOSCSF, PROTEINCSF, GLUCCSF, JCVIRUS, CSFOLI, IGGCSF, LABACHR, ACETBL   Inflammation (CRP: Acute  ESR: Chronic) Lab Results  Component Value Date   LATICACIDVEN 1.1 05/18/2024     Rheumatology No results found for: RF, ANA, LABURIC, URICUR, LYMEIGGIGMAB, LYMEABIGMQN, HLAB27   Coagulation Lab Results  Component Value Date   INR 1.3 (H) 05/18/2024   LABPROT 16.5 (H) 05/18/2024   APTT 30 05/18/2024   PLT 100 (L) 06/25/2024     Cardiovascular Lab Results  Component Value Date   BNP 10.0 02/13/2020   CKTOTAL 34 (L) 12/12/2017   CKMB < 0.5 (L) 04/30/2012   TROPONINI <0.03 12/12/2017   HGB 12.0 (L) 06/25/2024   HCT 35.3 (L) 06/25/2024     Screening Lab Results  Component Value Date   SARSCOV2NAA NEGATIVE 05/18/2024   HCVAB NON REACTIVE 05/19/2024     Cancer No results found for: CEA, CA125, LABCA2   Allergens No results found for: ALMOND, APPLE, ASPARAGUS, AVOCADO, BANANA, BARLEY, BASIL, BAYLEAF, GREENBEAN, LIMABEAN, WHITEBEAN, BEEFIGE, REDBEET, BLUEBERRY, BROCCOLI, CABBAGE, MELON, CARROT, CASEIN, CASHEWNUT, CAULIFLOWER, CELERY     Note: Lab results reviewed.  PFSH  Drug: David Christensen  reports no history of drug  use. Alcohol:  reports current alcohol use of about 2.0 standard drinks of alcohol per week. Tobacco:  reports that he has been smoking cigarettes. He has never used smokeless tobacco. Medical:  has a past medical history of Acid reflux, Cancer (HCC), Depression, Diabetes mellitus without complication (HCC), Hypertension, and PTSD (post-traumatic stress disorder). Family: family history includes Lung disease in his mother; Stroke in his father.  Past Surgical History:  Procedure Laterality Date   BACK SURGERY     CHOLECYSTECTOMY N/A 02/28/2015   Procedure: LAPAROSCOPIC CHOLECYSTECTOMY;  Surgeon: Lonni Brands, MD;  Location: [facility]S;  Service: General;  Laterality: N/A;   MELANOMA EXCISION     PACEMAKER INSERTION Left    PROSTATECTOMY     TONSILLECTOMY AND ADENOIDECTOMY     Active Ambulatory Problems    Diagnosis Date Noted   Abdominal pain 02/26/2015   Cholecystitis 02/27/2015   GI bleeding 05/17/2024   Elevated LFTs 05/17/2024   AKI (acute kidney injury) 05/17/2024  Sepsis due to pneumonia (HCC) 05/17/2024   Essential hypertension 05/17/2024   Hypokalemia 05/17/2024   Dyslipidemia 05/17/2024   Depression 05/17/2024   Pancytopenia (HCC) 05/19/2024   Melena 05/20/2024   Iron deficiency anemia due to chronic blood loss 05/21/2024   Chronic pain syndrome 10/15/2024   Chronic pain of both lower extremities 10/15/2024   Polyneuropathy 10/15/2024   Resolved Ambulatory Problems    Diagnosis Date Noted   No Resolved Ambulatory Problems   Past Medical History:  Diagnosis Date   Acid reflux    Cancer (HCC)    Diabetes mellitus without complication (HCC)    Hypertension    PTSD (post-traumatic stress disorder)    Constitutional Exam  General appearance: Well nourished, well developed, and well hydrated. In no apparent acute distress Vitals:   10/15/24 1015  BP: (!) 175/88  Pulse: 87  Resp: 16  Temp: (!) 96.6 F (35.9 C)  TempSrc: Temporal  SpO2: 95%  Weight:  173 lb (78.5 kg)  Height: 5' 7 (1.702 m)   BMI Assessment: Estimated body mass index is 27.1 kg/m as calculated from the following:   Height as of this encounter: 5' 7 (1.702 m).   Weight as of this encounter: 173 lb (78.5 kg).  BMI interpretation table: BMI level Category Range association with higher incidence of chronic pain  <18 kg/m2 Underweight   18.5-24.9 kg/m2 Ideal body weight   25-29.9 kg/m2 Overweight Increased incidence by 20%  30-34.9 kg/m2 Obese (Class I) Increased incidence by 68%  35-39.9 kg/m2 Severe obesity (Class II) Increased incidence by 136%  >40 kg/m2 Extreme obesity (Class III) Increased incidence by 254%   Patient's current BMI Ideal Body weight  Body mass index is 27.1 kg/m. Ideal body weight: 66.1 kg (145 lb 11.6 oz) Adjusted ideal body weight: 71 kg (156 lb 10.1 oz)   BMI Readings from Last 4 Encounters:  10/15/24 27.10 kg/m  06/25/24 27.24 kg/m  06/02/24 27.24 kg/m  05/18/24 27.24 kg/m   Wt Readings from Last 4 Encounters:  10/15/24 173 lb (78.5 kg)  06/25/24 173 lb 15.1 oz (78.9 kg)  06/02/24 173 lb 15.1 oz (78.9 kg)  05/18/24 173 lb 15.1 oz (78.9 kg)    Psych/Mental status: Alert, oriented x 3 (person, place, & time)       Eyes: PERLA Respiratory: No evidence of acute respiratory distress  Lumbar Spine Area Exam  Skin & Axial Inspection: No masses, redness, or swelling Alignment: Symmetrical Functional ROM: Pain restricted ROM affecting both sides Stability: No instability detected Muscle Tone/Strength: Functionally intact. No obvious neuro-muscular anomalies detected. Sensory (Neurological): Musculoskeletal pain pattern  Gait & Posture Assessment  Ambulation: Unassisted Gait: Relatively normal for age and body habitus Posture: WNL  Lower Extremity Exam    Side: Right lower extremity  Side: Left lower extremity  Stability: No instability observed          Stability: No instability observed          Skin & Extremity Inspection:  Skin color, temperature, and hair growth are WNL. No peripheral edema or cyanosis. No masses, redness, swelling, asymmetry, or associated skin lesions. No contractures.  Skin & Extremity Inspection: Skin color, temperature, and hair growth are WNL. No peripheral edema or cyanosis. No masses, redness, swelling, asymmetry, or associated skin lesions. No contractures.  Functional ROM: Unrestricted ROM                  Functional ROM: Unrestricted ROM  Muscle Tone/Strength: Functionally intact. No obvious neuro-muscular anomalies detected.  Muscle Tone/Strength: Functionally intact. No obvious neuro-muscular anomalies detected.  Sensory (Neurological): Musculoskeletal pain pattern        Sensory (Neurological): Musculoskeletal pain pattern        DTR: Patellar: deferred today Achilles: deferred today Plantar: deferred today  DTR: Patellar: deferred today Achilles: deferred today Plantar: deferred today  Palpation: No palpable anomalies  Palpation: No palpable anomalies    Assessment  Primary Diagnosis & Pertinent Problem List: The primary encounter diagnosis was Chronic pain syndrome. Diagnoses of Chronic pain of both lower extremities and Polyneuropathy were also pertinent to this visit.  Visit Diagnosis (New problems to examiner): 1. Chronic pain syndrome   2. Chronic pain of both lower extremities   3. Polyneuropathy    Plan of Care (Initial workup plan)  David Christensen is a 74 year old male with chronic pain involving the low back in the setting of lumbar spondylosis and bilateral lower leg pain consistent with peripheral polyneuropathy, now presenting specifically requesting medication management, with his primary complaint being persistent bilateral calf pain. Prior records reveal longstanding difficulty with adherence and medication safety, including repeated short pill counts and misuse of multiple opioid formulations (Percocet, Belbuca, Butrans), resulting in prior tapers  and discontinuation. Opioids were briefly restarted in 2017 due to debilitating pain after behavioral therapy; however, subsequent monitoring again demonstrated concerns for misuse. He has also stopped gabapentin  due to sedation. His calf symptoms may be multifactorial, including neuropathic pain, vascular insufficiency, and musculoskeletal contributors, with upcoming vascular evaluation pending. Given his comorbidities, prior misuse history, and current functional status, he remains at high risk for harm with traditional opioid therapy.  Given the request for medication options, we reviewed that full-agonist opioids (e.g., oxycodone , hydromorphone , morphine ) are not clinically appropriate in his case due to: (1) documented medication non-adherence and repeated short counts; (2) prior misuse across several opioid formulations; (3) increased risk of overdose, falls, cognitive impairment, and worsening function at his age; and (4) lack of durable benefit expected for neuropathic and calf-predominant pain syndromes. Restarting traditional opioids would present a greater risk than benefit and is not recommended.  We also do not recommend tramadol , despite his request, due to its serotonergic properties and elevated risk of serotonin syndrome when combined with his existing psychiatric medications, as well as seizure risk and potential for misuse in someone with his risk profile.  We discussed that if medication support is absolutely needed, the only opioid-based option that may be considered would be buprenorphine in the form of Belbuca or Butrans, given its ceiling effect on respiratory depression, lower misuse potential compared with full agonists, and potential benefit in chronic neuropathic-type pain. However, any buprenorphine therapy would require strict adherence, close monitoring, and alignment with functional goals.    Lab Orders         Compliance Drug Analysis, Ur      Provider-requested  follow-up: Return in about 3 weeks (around 11/05/2024) for 2nd visit- Butrans.  Future Appointments  Date Time Provider Department Center  11/05/2024 11:00 AM Marcelino Nurse, MD [facility] None   I discussed the assessment and treatment plan with the patient. The patient was provided an opportunity to ask questions and all were answered. The patient agreed with the plan and demonstrated an understanding of the instructions.  Patient advised to call back or seek an in-person evaluation if the symptoms or condition worsens.  I personally spent a total of 60 minutes in the care of the patient today  including preparing to see the patient, getting/reviewing separately obtained history, performing a medically appropriate exam/evaluation, counseling and educating, placing orders, and documenting clinical information in the EHR.   Note by: Wallie Sherry, MD (TTS and AI technology used. I apologize for any typographical errors that were not detected and corrected.) Date: 10/15/2024; Time: 11:20 AM     [1]  Current Outpatient Medications:    acetaminophen  (TYLENOL ) 500 MG tablet, Take 1,000 mg by mouth daily., Disp: , Rfl:    albuterol  (PROVENTIL  HFA;VENTOLIN  HFA) 108 (90 Base) MCG/ACT inhaler, Inhale 2 puffs into the lungs every 4 (four) hours as needed for wheezing or shortness of breath., Disp: 1 Inhaler, Rfl: 1   aspirin EC 81 MG tablet, Take 81 mg by mouth daily., Disp: , Rfl:    atorvastatin  (LIPITOR) 10 MG tablet, Take 10 mg by mouth daily., Disp: , Rfl:    diclofenac Sodium (VOLTAREN) 1 % GEL, Apply 4 g topically 4 (four) times daily., Disp: , Rfl:    EPINEPHrine  0.3 mg/0.3 mL IJ SOAJ injection, Inject 0.3 mg into the muscle as needed for anaphylaxis. INJECT 1 SYRINGE INTO OUTER THIGH ONCE AS NEEDED FOR SEVERE ALLERGIC REACTION., Disp: , Rfl:    feeding supplement (ENSURE PLUS HIGH PROTEIN) LIQD, Take 237 mLs by mouth 2 (two) times daily between meals., Disp: 20000 mL, Rfl: 2   fluticasone   (FLONASE ) 50 MCG/ACT nasal spray, Place 2 sprays into the nose at bedtime., Disp: , Rfl:    gabapentin  (NEURONTIN ) 100 MG capsule, Take 100 mg by mouth 3 (three) times daily., Disp: , Rfl:    guaiFENesin  (MUCINEX ) 600 MG 12 hr tablet, Take 1 tablet (600 mg total) by mouth 2 (two) times daily as needed for to loosen phlegm or cough., Disp: 30 tablet, Rfl: 0   hydrochlorothiazide  (HYDRODIURIL ) 12.5 MG tablet, Take 1 tablet by mouth daily., Disp: , Rfl:    losartan  (COZAAR ) 50 MG tablet, Take 50 mg by mouth daily., Disp: , Rfl:    metoprolol  succinate (TOPROL -XL) 25 MG 24 hr tablet, Take 25 mg by mouth daily., Disp: , Rfl:    naloxone (NARCAN) nasal spray 4 mg/0.1 mL, One spray in either nostril once for known/suspected opioid overdose. May repeat every 2-3 minutes in alternating nostril til EMS arrives, Disp: , Rfl:    pantoprazole  (PROTONIX ) 40 MG tablet, Take 40 mg by mouth 2 (two) times daily., Disp: , Rfl:    QUEtiapine  (SEROQUEL ) 100 MG tablet, TAKE 2&1/2 TABLETS BY MOUTH AT BEDTIME, Disp: , Rfl:    benzonatate  (TESSALON  PERLES) 100 MG capsule, Take 1 capsule (100 mg total) by mouth 3 (three) times daily as needed for cough. (Patient not taking: Reported on 10/15/2024), Disp: 30 capsule, Rfl: 0   DULoxetine (CYMBALTA) 60 MG capsule, Take 60 mg by mouth daily., Disp: , Rfl:    lidocaine  (XYLOCAINE ) 5 % ointment, Apply 1 Application topically 3 (three) times daily. (Patient not taking: Reported on 10/15/2024), Disp: , Rfl:    meloxicam  (MOBIC ) 7.5 MG tablet, Take 1 tablet (7.5 mg total) by mouth daily as needed for pain. (Patient not taking: Reported on 10/15/2024), Disp: , Rfl:    oxyCODONE  (ROXICODONE ) 5 MG immediate release tablet, Take 1 tablet (5 mg total) by mouth every 8 (eight) hours as needed. (Patient not taking: Reported on 10/15/2024), Disp: 15 tablet, Rfl: 0   potassium chloride  (KLOR-CON  M) 10 MEQ tablet, Take 1 tablet (10 mEq total) by mouth 2 (two) times daily. (Patient not taking: Reported on  10/15/2024), Disp: 14 tablet, Rfl: 0   prazosin (MINIPRESS) 2 MG capsule, Take 4 mg by mouth 2 (two) times daily. (Patient not taking: Reported on 10/15/2024), Disp: , Rfl:    senna (SENOKOT) 8.6 MG TABS tablet, Take 1 tablet by mouth daily. (Patient not taking: Reported on 10/15/2024), Disp: , Rfl:    tamsulosin  (FLOMAX ) 0.4 MG CAPS capsule, Take 1 capsule by mouth daily. (Patient not taking: Reported on 10/15/2024), Disp: , Rfl:    traMADol  (ULTRAM ) 50 MG tablet, Take 1 tablet (50 mg total) by mouth every 6 (six) hours as needed for moderate pain (pain score 4-6) or severe pain (pain score 7-10). (Patient not taking: Reported on 10/15/2024), Disp: 30 tablet, Rfl: 0   zolpidem  (AMBIEN ) 10 MG tablet, Take 10 mg by mouth at bedtime as needed. (Patient not taking: Reported on 10/15/2024), Disp: , Rfl:

## 2024-10-17 LAB — COMPLIANCE DRUG ANALYSIS, UR

## 2024-11-05 ENCOUNTER — Encounter: Admitting: Student in an Organized Health Care Education/Training Program

## 2024-11-12 ENCOUNTER — Encounter: Payer: Self-pay | Admitting: Student in an Organized Health Care Education/Training Program

## 2024-11-12 ENCOUNTER — Ambulatory Visit: Admitting: Student in an Organized Health Care Education/Training Program

## 2024-11-12 VITALS — BP 164/101 | HR 80 | Temp 97.1°F | Resp 16 | Ht 66.0 in | Wt 172.0 lb

## 2024-11-12 DIAGNOSIS — M79605 Pain in left leg: Secondary | ICD-10-CM | POA: Diagnosis not present

## 2024-11-12 DIAGNOSIS — G894 Chronic pain syndrome: Secondary | ICD-10-CM | POA: Diagnosis not present

## 2024-11-12 DIAGNOSIS — G629 Polyneuropathy, unspecified: Secondary | ICD-10-CM

## 2024-11-12 DIAGNOSIS — M79604 Pain in right leg: Secondary | ICD-10-CM | POA: Diagnosis not present

## 2024-11-12 DIAGNOSIS — F1110 Opioid abuse, uncomplicated: Secondary | ICD-10-CM | POA: Diagnosis not present

## 2024-11-12 DIAGNOSIS — G8929 Other chronic pain: Secondary | ICD-10-CM

## 2024-11-12 MED ORDER — BUPRENORPHINE 7.5 MCG/HR TD PTWK: 1.0000 | MEDICATED_PATCH | TRANSDERMAL | 0 refills | Status: AC

## 2024-11-12 MED ORDER — BUPRENORPHINE 5 MCG/HR TD PTWK: 1.0000 | MEDICATED_PATCH | TRANSDERMAL | 0 refills | Status: AC

## 2024-11-12 NOTE — Patient Instructions (Signed)
 Sign pain contract.

## 2024-11-12 NOTE — Progress Notes (Signed)
 Safety precautions to be maintained throughout the outpatient stay will include: orient to surroundings, keep bed in low position, maintain call bell within reach at all times, provide assistance with transfer out of bed and ambulation.

## 2024-12-30 ENCOUNTER — Encounter: Admitting: Nurse Practitioner
